# Patient Record
Sex: Male | Born: 1960
Health system: Southern US, Community
[De-identification: ages and names within clinical notes are randomized; demographics above are authoritative.]

## PROBLEM LIST (undated history)

## (undated) DIAGNOSIS — I219 Acute myocardial infarction, unspecified: Secondary | ICD-10-CM

## (undated) DIAGNOSIS — E119 Type 2 diabetes mellitus without complications: Secondary | ICD-10-CM

## (undated) DIAGNOSIS — K219 Gastro-esophageal reflux disease without esophagitis: Secondary | ICD-10-CM

## (undated) DIAGNOSIS — E611 Iron deficiency: Secondary | ICD-10-CM

## (undated) DIAGNOSIS — K922 Gastrointestinal hemorrhage, unspecified: Secondary | ICD-10-CM

## (undated) DIAGNOSIS — I10 Essential (primary) hypertension: Secondary | ICD-10-CM

## (undated) DIAGNOSIS — E7800 Pure hypercholesterolemia, unspecified: Secondary | ICD-10-CM

## (undated) DIAGNOSIS — C801 Malignant (primary) neoplasm, unspecified: Secondary | ICD-10-CM

## (undated) DIAGNOSIS — A048 Other specified bacterial intestinal infections: Secondary | ICD-10-CM

## (undated) HISTORY — PX: CARDIAC CATHETERIZATION: SHX172

## (undated) HISTORY — PX: OTHER SURGICAL HISTORY: SHX169

## (undated) HISTORY — PX: HERNIA REPAIR: SHX51

## (undated) HISTORY — PX: APPENDECTOMY: SHX54

## (undated) HISTORY — PX: COLECTOMY: SHX59

---

## 1999-04-09 ENCOUNTER — Ambulatory Visit (HOSPITAL_COMMUNITY): Admission: RE | Admit: 1999-04-09 | Discharge: 1999-04-09 | Payer: Self-pay | Admitting: Cardiology

## 1999-04-09 ENCOUNTER — Encounter: Payer: Self-pay | Admitting: Cardiology

## 1999-06-04 ENCOUNTER — Encounter: Payer: Self-pay | Admitting: Cardiology

## 1999-06-04 ENCOUNTER — Ambulatory Visit (HOSPITAL_COMMUNITY): Admission: RE | Admit: 1999-06-04 | Discharge: 1999-06-04 | Payer: Self-pay | Admitting: Cardiology

## 2001-03-11 ENCOUNTER — Ambulatory Visit (HOSPITAL_COMMUNITY): Admission: RE | Admit: 2001-03-11 | Discharge: 2001-03-11 | Payer: Self-pay | Admitting: Cardiology

## 2001-03-11 ENCOUNTER — Encounter: Payer: Self-pay | Admitting: Cardiology

## 2004-12-11 ENCOUNTER — Emergency Department (HOSPITAL_COMMUNITY): Admission: EM | Admit: 2004-12-11 | Discharge: 2004-12-12 | Payer: Self-pay | Admitting: Emergency Medicine

## 2005-08-05 ENCOUNTER — Inpatient Hospital Stay (HOSPITAL_COMMUNITY): Admission: EM | Admit: 2005-08-05 | Discharge: 2005-08-07 | Payer: Self-pay | Admitting: Emergency Medicine

## 2006-10-16 ENCOUNTER — Emergency Department (HOSPITAL_COMMUNITY): Admission: EM | Admit: 2006-10-16 | Discharge: 2006-10-17 | Payer: Self-pay | Admitting: Emergency Medicine

## 2007-06-10 ENCOUNTER — Ambulatory Visit (HOSPITAL_COMMUNITY): Admission: RE | Admit: 2007-06-10 | Discharge: 2007-06-10 | Payer: Self-pay | Admitting: Cardiology

## 2008-05-28 ENCOUNTER — Inpatient Hospital Stay (HOSPITAL_COMMUNITY): Admission: EM | Admit: 2008-05-28 | Discharge: 2008-05-31 | Payer: Self-pay | Admitting: Emergency Medicine

## 2010-07-12 LAB — CBC
HCT: 41.4 % (ref 39.0–52.0)
Hemoglobin: 14.6 g/dL (ref 13.0–17.0)
Hemoglobin: 15.4 g/dL (ref 13.0–17.0)
MCHC: 35.5 g/dL (ref 30.0–36.0)
Platelets: 199 10*3/uL (ref 150–400)
RBC: 4.72 MIL/uL (ref 4.22–5.81)
WBC: 8.9 10*3/uL (ref 4.0–10.5)

## 2010-07-17 LAB — CBC
HCT: 39.1 % (ref 39.0–52.0)
HCT: 40 % (ref 39.0–52.0)
Hemoglobin: 14.2 g/dL (ref 13.0–17.0)
MCHC: 35.6 g/dL (ref 30.0–36.0)
MCV: 92.3 fL (ref 78.0–100.0)
Platelets: 215 10*3/uL (ref 150–400)
Platelets: 249 10*3/uL (ref 150–400)
RDW: 12.8 % (ref 11.5–15.5)
RDW: 13.2 % (ref 11.5–15.5)

## 2010-07-17 LAB — DIFFERENTIAL
Basophils Absolute: 0 10*3/uL (ref 0.0–0.1)
Basophils Relative: 0 % (ref 0–1)
Eosinophils Absolute: 0.1 10*3/uL (ref 0.0–0.7)
Eosinophils Relative: 1 % (ref 0–5)
Monocytes Absolute: 0.5 10*3/uL (ref 0.1–1.0)

## 2010-07-17 LAB — COMPREHENSIVE METABOLIC PANEL
AST: 16 U/L (ref 0–37)
Albumin: 3.3 g/dL — ABNORMAL LOW (ref 3.5–5.2)
Alkaline Phosphatase: 56 U/L (ref 39–117)
BUN: 10 mg/dL (ref 6–23)
GFR calc Af Amer: >60 mL/min (ref >60)
Potassium: 4 mEq/L (ref 3.5–5.1)
Total Protein: 5.6 g/dL — ABNORMAL LOW (ref 6.0–8.3)

## 2010-07-17 LAB — POCT CARDIAC MARKERS
Myoglobin, poc: 80.6 ng/mL (ref 12–200)
Myoglobin, poc: 83.8 ng/mL (ref 12–200)
Troponin i, poc: <0.05 ng/mL (ref 0.00–0.09)

## 2010-07-17 LAB — CARDIAC PANEL(CRET KIN+CKTOT+MB+TROPI)
CK, MB: 1.1 ng/mL (ref 0.3–4.0)
CK, MB: 1.3 ng/mL (ref 0.3–4.0)
Relative Index: INVALID (ref 0.0–2.5)
Relative Index: INVALID (ref 0.0–2.5)
Relative Index: INVALID (ref 0.0–2.5)
Total CK: 85 U/L (ref 7–232)
Troponin I: <0.01 ng/mL (ref 0.00–0.06)
Troponin I: <0.01 ng/mL (ref 0.00–0.06)

## 2010-07-17 LAB — BRAIN NATRIURETIC PEPTIDE: Pro B Natriuretic peptide (BNP): 32 pg/mL (ref 0.0–100.0)

## 2010-07-17 LAB — BASIC METABOLIC PANEL
BUN: 8 mg/dL (ref 6–23)
CO2: 24 mEq/L (ref 19–32)
Glucose, Bld: 90 mg/dL (ref 70–99)
Potassium: 4.1 mEq/L (ref 3.5–5.1)
Sodium: 139 mEq/L (ref 135–145)

## 2010-07-17 LAB — LIPID PANEL
Cholesterol: 153 mg/dL (ref 0–200)
HDL: 18 mg/dL — ABNORMAL LOW (ref >39)
LDL Cholesterol: 112 mg/dL — ABNORMAL HIGH (ref 0–99)
Total CHOL/HDL Ratio: 8.5 RATIO

## 2010-07-17 LAB — HEPARIN LEVEL (UNFRACTIONATED): Heparin Unfractionated: 0.48 IU/mL (ref 0.30–0.70)

## 2010-07-17 LAB — PROTIME-INR: Prothrombin Time: 12.4 seconds (ref 11.6–15.2)

## 2010-08-14 NOTE — Discharge Summary (Signed)
NAMEJAYLAND, NULL                ACCOUNT NO.:  [id number]   MEDICAL RECORD NO.:  [id number]          PATIENT TYPE:  INP   LOCATION:  2030                         FACILITY:  [facility]   PHYSICIAN:  Mohan N. Sharyn Lull, M.D. DATE OF BIRTH:  [birth year]   DATE OF ADMISSION:  05/28/2008  DATE OF DISCHARGE:  05/31/2008                               DISCHARGE SUMMARY   ADMITTING DIAGNOSES:  1. Unstable angina, rule out myocardial infarction.  2. Coronary artery disease.   FINAL DIAGNOSES:  1. Stable angina.  2. Coronary artery disease.  3. 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  4. Hypertension.  5. Hypercholesteremia.  6. History of tobacco abuse.  7. Positive family history of coronary artery disease.   DISCHARGE HOME MEDICATIONS:  1. Enteric-coated aspirin 325 mg 1 tablet daily.  2. Plavix 75 mg 1 tablet daily.  3. Altace 5 mg 1 capsule twice daily.  4. Cozaar 100 mg 1 tablet daily.  5. Lipitor 80 mg 1 tablet daily.  6. Niaspan 500 mg 1 tablet twice daily.  7. Atenolol 50 mg 1 tablet daily.  8. Imdur 60 mg 1 tablet daily.  9. Xanax 0.25 mg 1 tablet twice daily.  10.Pepcid 20 mg 1 tablet twice daily.   DIET:  Low salt and low cholesterol.   ACTIVITY:  As tolerated.   CONDITION AT DISCHARGE:  Stable.   FOLLOWUP:  Follow up with me in 1 week.   BRIEF HISTORY AND HOSPITAL COURSE:  Mr. Elhadji Pecore is 50 year old  white male with past medical history significant for coronary artery  disease, history of inferior wall MI in August 1997.  He had PTCA  stenting to left circumflex, hypertension, hypercholesteremia, history  of tobacco abuse, was admitted by Dr. Shana Chute on May 28, 2008 with  substernal chest discomfort radiating to the left hand and fingers.  Denies any nausea or vomiting, and diaphoresis.  He took 2 sublingual  nitro with relief.  EKG done in the ED showed old inferior wall MI with  no acute ischemic changes.   PAST MEDICAL HISTORY:  As  above.   FAMILY HISTORY:  Father died at the age of 4 due to massive MI.   SOCIAL HISTORY:  He is former smoker.   ALLERGIES:  No known drug allergies.   MEDICATIONS:  As above.   PHYSICAL EXAMINATION:  VITAL SIGNS:  His blood pressure was 171/98,  pulse was 77.  He was afebrile.  NECK:  Supple.  No JVD.  No bruits.  LUNGS:  Clear to auscultation without rhonchi and rales.  CARDIOVASCULAR:  S1 and S2 was normal.  There was no S3 or S4 gallop.  There was no pericardial rub.  ABDOMEN:  Soft.  Bowel sounds are present, nontender.  EXTREMITIES:  There is no clubbing, cyanosis, or edema.   LABORATORY DATA:  Hemoglobin was 14.2, hematocrit 40.0, white count of  6.3.  Potassium was 4.1, glucose 90, BUN 8, creatinine 0.99.  His 2 sets  of cardiac enzymes were negative.  BNP was 32.  His lipid panel was not  done  in this admission.  Persantine Myoview showed very small area of  ischemia involving the inferolateral portion of the left ventricular  apex with normal EF of 68% with normal left ventricular wall motion.  Chest x-ray showed low lung volumes, no evidence of infiltrate, edema or  effusions.   BRIEF HOSPITAL COURSE:  The patient was admitted to Telemetry Unit.  MI  was ruled out by serial enzymes and EKG.  The patient did not had any  episodes of chest pain during the hospital stay.  The patient  subsequently underwent Persantine Myoview, which showed very small focus  of ischemia and the inferolateral wall at the apex with normal LV  systolic function.  Discussed at length with the patient regarding  various options of treatment, i.e., medical versus invasive left cath  possible PTCA stenting as the patient had very atypical chest pain,  which lasted few minutes only and as no EKG changes and the patient has  been ambulating in hallway without any problems, the patient agreed for  medical management for now and if he continues to have recurrent chest  pain, we will consider  invasive treatment as an outpatient.  The patient  will be discharged home on the above medications and will be followed up  in my office in 1 week.      Eduardo Osier. Sharyn Lull, M.D.  Electronically Signed     MNH/MEDQ  D:  05/31/2008  T:  05/31/2008  Job:  [id number]

## 2010-08-17 NOTE — Cardiovascular Report (Signed)
NAMEKRRISH, FREUND                 ACCOUNT NO.:  [id number]   MEDICAL RECORD NO.:  [id number]          PATIENT TYPE:  INP   LOCATION:  6526                         FACILITY:  [facility]   PHYSICIAN:  Mohan N. Sharyn Lull, M.D. DATE OF BIRTH:  [birth year]   DATE OF PROCEDURE:  08/06/2005  DATE OF DISCHARGE:  08/07/2005                              CARDIAC CATHETERIZATION   PROCEDURE:  1.  Left cardiac catheterization with selective left and right coronary      angiography, left ventriculography via right groin using Judkins      technique.  2.  Successful percutaneous transluminal coronary angioplasty to mid and      distal left circumflex using 3.0 x 12-mm-long Maverick balloon.  3.  Successful deployment of 3.0 x 33-mm-long CYPHER drug-eluting stent in      mid and distal left circumflex.  4.  Successful post dilatation of the stent using 8.25 x 13-mm-long      PowerSail balloon.   INDICATIONS FOR PROCEDURE:  Mr. Tosh is a 50 year old white male with past  medical history significant for coronary artery disease, status post  inferior wall in August 1997, status post PCI to left circumflex,  hypertension, hypercholesteremia, history of tobacco abuse, who came to the  ER complaining of dull aching chest pain radiating to the left arm, grade  5/10, and took 2 aspirin with relief.  The patient went to [facility] ER  and as pain was resolved while going to the ER, he went home, again started  having retrosternal chest pain radiating to the left arm and he took 1  sublingual nitro with relief of chest pain.  He denies any nausea, vomiting  or diaphoresis, denies shortness of breath, denies palpitation,  lightheadedness or syncope, denies relation of chest pain to food, breathing  or movement, denies PND, orthopnea or leg swelling.  He states chest pain  appears to be similar in nature when he had MI, but not so severe when he  had MI in the past.  The patient was admitted to telemetry unit.  MI  was  ruled out by serial enzymes and EKG.  Due to typical anginal chest pain and  multiple risk factors, I discussed with the patient regarding left cath,  possible PTCA and stenting, its risks and benefits, i.e., death, MI, stroke,  need for emergency CABG, risk of restenosis, local vascular complications,  etc, and consented for the procedure.   DESCRIPTION OF PROCEDURE:  After obtaining the informed consent, the patient  was brought to the cath lab and was placed on fluoroscopy table.  Right  groin was prepped and draped in the usual fashion.  2% Xylocaine was used  for local anesthesia in the right groin.  With the help of a thin-wall  needle, a 6-French arterial sheath was placed.  Sheath was aspirated and  flushed.  Next, a 6-French left Judkins catheter was advanced over the wire  under fluoroscopic guidance up to the ascending aorta.  Wire was pulled out.  The catheter was aspirated and connected to the manifold.  Catheter was  further  advanced and engaged into left coronary ostium.  Multiple views of  the left system were taken.  Next, the catheter was disengaged and was  pulled out over the wire and was replaced with 6-French right Judkins  catheter, which was advanced over the wire under fluoroscopic guidance up to  the ascending aorta.  Wire was pulled out.  The catheter was aspirated and  connected to the manifold.  Catheter was further advanced and engaged into  the right coronary ostium.  A single view of the right coronary artery was  obtained.  Next, the catheter was disengaged and was pulled out over the  wire and was replaced with a 6-French pigtail catheter, which was advanced  over the wire under fluoroscopic guidance up to the ascending aorta.  Wire  was pulled out.  The catheter was aspirated and connected to the manifold.  Catheter was further advanced across the aortic valve into the LV.  LV  pressures were recorded.  Next left ventriculography was done in  30-degree  RAO position.  Post-angiographic pressures were recorded from LV and then  pullback pressures were recorded from the aorta.  There was no gradient  across the aortic valve.  Next, the pigtail catheter was pulled out over the  wire, sheaths were aspirated and flushed.   FINDINGS:  LV showed inferobasal wall hypokinesia, LVH with EF of 45% to  50%.  Left main has 40% to 50% ostial stenosis.  LAD has 15% to 20% proximal  and mid-stenosis and 30% to 40% distal stenosis.  Diagonal 1 to diagonal 3  were very small.  Left circumflex has 30% to 40% proximal stenosis and then  a 85% to 90% sequential mid and distal stenosis.  OM-1 is less than 0.5 mm.  OM-2 is moderate-sized, which has 10% to 20% stenosis.  OM-3 is less than  0.5 mm.  OM-4 is less than 1.25 mm, a very small vessel which is diffusely  diseased.  RCA was 100% occluded, which is a small nondominant vessel  filling faintly from left system..   INTERVENTIONAL PROCEDURE:  Successful PTCA to mid and distal left circumflex  was done using 3.0 x 12-mm-long Maverick balloon for predilatation and then  3.0 x 33-mm-long CYPHER drug-eluting stent was deployed at 13 atmospheric  pressure in mid and distal left circumflex.  Stent was postdilated using  3.25 x 13-mm-long PowerSail balloon going up to 18 atmospheric pressure.  Lesion was dilated from 85% to 90% to 0% residual and with excellent TIMI  grade 3 distal flow without evidence of dissection or distal embolization.  The patient had occlusion of a very small OM-4 due to plaque shift versus  occlusion due to the stent skirt, which was not felt suitable for PCI and  was a very small vessel.  The patient received weight-based heparin,  Integrilin and 600 mg of Plavix during the procedure.  The patient tolerated  the procedure well.  There were no complications.  The patient was  transferred to recovery room in stable condition.  Post procedure, the patient's EKG appears to be  normal with no evidence of acute ischemic  changes or infarct.           ______________________________  Eduardo Osier. Sharyn Lull, M.D.     MNH/MEDQ  D:  08/07/2005  T:  08/08/2005  Job:  [id number]   cc:   [facility]

## 2010-08-17 NOTE — Discharge Summary (Signed)
James Russell, James Russell                 ACCOUNT NO.:  [id number]   MEDICAL RECORD NO.:  [id number]          PATIENT TYPE:  INP   LOCATION:  6526                         FACILITY:  [facility]   PHYSICIAN:  Mohan N. Sharyn Lull, M.D. DATE OF BIRTH:  [birth year]   DATE OF ADMISSION:  08/04/2005  DATE OF DISCHARGE:  08/07/2005                                 DISCHARGE SUMMARY   ADMITTING DIAGNOSES:  1.  New onset angina/unstable angina, rule out myocardial infarction.  2.  Coronary artery disease.  3.  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  4.  Hypertension.  5.  Hypercholesteremia.  6.  Tobacco abuse.  7.  Positive family history of coronary artery disease.   FINAL DIAGNOSES:  1.  Status post unstable angina.  2.  Status post percutaneous coronary intervention to mid and distal left      circumflex as per procedure report.  3.  Coronary artery disease.  4.  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  5.  Hypertension.  6.  Hypercholesteremia.  7.  Prediabetic.  8.  Tobacco abuse.  9.  Positive family history of coronary artery disease.   DISCHARGE HOME MEDICATIONS:  1.  Enteric-coated aspirin 325 mg one tablet daily.  2.  Plavix 75 mg one tablet daily with food.  3.  Toprol-XL 100 mg one tablet daily.  4.  Altace 10 mg one capsule daily.  5.  Vytorin 10/80 one tablet daily.  6.  Niaspan 500 mg one tablet twice daily.  7.  Nitrostat 0.4 mg sublingual used as directed.   DIET:  Low salt, low cholesterol, 1800 calories ADA diet.  The patient has  been advised to avoid sweets, ice cream cake.   ACTIVITY:  Avoid any lifting, pushing or pulling for 48 hours.  Post PTCA  and stent instructions have been given.  Follow up with me in 1 week.   CONDITION AT DISCHARGE:  Stable.   BRIEF HISTORY AND HOSPITAL COURSE:  James Russell is 50 year old white male with  past medical history significant for coronary artery disease status post  inferior wall MI in  August  1997 - he had PCI to left circumflex,  hypertension, hypercholesteremia, history of tobacco abuse.  He came to the  ER complaining of developing chest pain radiating to the left arm, 5/10.  Took two aspirin with relief while going to [facility] ER and again started  having chest pain radiating to the left arm, took one sublingual  nitroglycerin with relief of chest pain.  Denies any nausea, diaphoresis,  shortness of breath.  Denies palpation, lightheadedness or syncope.  Denies  relation of chest pain to food, breathing or movement.  Denies PND,  orthopnea, leg swelling.  States chest pain appears to be similar in nature  but much more severe when he had MI.   PAST MEDICAL HISTORY:  He had back surgery.  Status post appendectomy in the  past.   ALLERGIES:  No known drug allergies.   MEDICATION AT HOME:  He was on:  1.  Toprol-XL 100  mg p.o. q.a.m. and 50 mg p.o. h.s.  2.  Zocor 80 mg p.o. daily.  3.  Aspirin 81 mg p.o. daily.   SOCIAL HISTORY:  He is married and from [place].  Works at [employer] as  Engineer, agricultural.  Smokes one-fourth pack per day now, used to smoke a-  half to one pack daily before.  No history of alcohol or drug abuse.   FAMILY HISTORY:  Father died of a massive MI at the age of 67.  He was  hypertensive, diabetic.  Mother is alive.  She has coronary artery disease.  Two sisters on good health.   EXAMINATION:  GENERAL:  He is alert, awake, oriented x3, in no acute  distress.  VITAL SIGNS:  Blood pressure was 158/93, pulse was 91 and regular.  HEENT:  Conjunctivae were pink.  NECK:  Supple, no JVD, no bruit.  LUNGS:  Clear to auscultation bilaterally.   Three sets of CPK, MB and troponin-I point-of-care were normal.  Total  cholesterol was 182, HDL was 24, LDL 129.  His potassium was 3.8, glucose  116, BUN 9, creatinine 0.9.  CK by lab was 140, MB 2.4.  Second set CK 111,  MB 2.2.  Third set CK postprocedure was 213, MB of 17, 6.0. MI was ruled out  by  serial enzymes and EKG.  The patient subsequently underwent left cardiac  catheterization with selective left and right coronary angiography and PTCA  and stenting to left circumflex as per procedure report.  The patient  tolerated the procedure well.  The patient did have mild chest discomfort  during the procedure due to occlusion of very, very small OM-4 branch of  circumflex which was felt not suitable for any intervention.  The patient  did not have further issues and will be followed up in my office in 1 week.           ______________________________  Eduardo Osier. Sharyn Lull, M.D.     MNH/MEDQ  D:  08/07/2005  T:  08/08/2005  Job:  [id number]

## 2010-12-24 LAB — HEPATIC FUNCTION PANEL
AST: 21
Bilirubin, Direct: 0.1
Indirect Bilirubin: 0.6

## 2010-12-24 LAB — BASIC METABOLIC PANEL
Calcium: 9
GFR calc Af Amer: >60
GFR calc non Af Amer: >60
Glucose, Bld: 116 — ABNORMAL HIGH
Sodium: 140

## 2010-12-24 LAB — LIPID PANEL
Cholesterol: 120
LDL Cholesterol: 82
Triglycerides: 86
VLDL: 17

## 2011-01-14 LAB — COMPREHENSIVE METABOLIC PANEL
Alkaline Phosphatase: 56
BUN: 8
CO2: 25
Chloride: 106
Creatinine, Ser: 1
GFR calc non Af Amer: >60
Potassium: 4.8
Total Bilirubin: 0.8

## 2011-01-14 LAB — URINALYSIS, ROUTINE W REFLEX MICROSCOPIC
Bilirubin Urine: NEGATIVE
Glucose, UA: NEGATIVE
Ketones, ur: NEGATIVE
pH: 6

## 2011-01-14 LAB — DIFFERENTIAL
Basophils Absolute: 0.1
Basophils Relative: 1
Eosinophils Relative: 1
Lymphocytes Relative: 26
Neutro Abs: 4

## 2011-01-14 LAB — CBC
HCT: 41.4
Hemoglobin: 14.6
MCV: 91.1
RBC: 4.55
WBC: 6.3

## 2011-01-14 LAB — LIPASE, BLOOD: Lipase: 31

## 2011-02-19 ENCOUNTER — Other Ambulatory Visit: Payer: Self-pay | Admitting: Cardiology

## 2011-05-13 ENCOUNTER — Other Ambulatory Visit: Payer: Self-pay | Admitting: Cardiology

## 2011-07-03 ENCOUNTER — Other Ambulatory Visit: Payer: Self-pay | Admitting: Cardiology

## 2011-10-17 ENCOUNTER — Other Ambulatory Visit: Payer: Self-pay | Admitting: Cardiology

## 2012-08-26 ENCOUNTER — Other Ambulatory Visit (HOSPITAL_COMMUNITY): Payer: Self-pay | Admitting: Cardiology

## 2012-08-26 DIAGNOSIS — R079 Chest pain, unspecified: Secondary | ICD-10-CM

## 2012-09-25 ENCOUNTER — Encounter (HOSPITAL_COMMUNITY): Payer: PRIVATE HEALTH INSURANCE

## 2013-04-28 ENCOUNTER — Emergency Department (HOSPITAL_BASED_OUTPATIENT_CLINIC_OR_DEPARTMENT_OTHER): Payer: PRIVATE HEALTH INSURANCE

## 2013-04-28 ENCOUNTER — Emergency Department (HOSPITAL_BASED_OUTPATIENT_CLINIC_OR_DEPARTMENT_OTHER)
Admission: EM | Admit: 2013-04-28 | Discharge: 2013-04-28 | Disposition: A | Discharge status: Home / Self Care | Payer: PRIVATE HEALTH INSURANCE | Attending: Emergency Medicine | Admitting: Emergency Medicine

## 2013-04-28 ENCOUNTER — Encounter (HOSPITAL_BASED_OUTPATIENT_CLINIC_OR_DEPARTMENT_OTHER): Payer: Self-pay | Admitting: Emergency Medicine

## 2013-04-28 DIAGNOSIS — I10 Essential (primary) hypertension: Secondary | ICD-10-CM | POA: Insufficient documentation

## 2013-04-28 DIAGNOSIS — E7800 Pure hypercholesterolemia, unspecified: Secondary | ICD-10-CM | POA: Insufficient documentation

## 2013-04-28 DIAGNOSIS — I252 Old myocardial infarction: Secondary | ICD-10-CM | POA: Insufficient documentation

## 2013-04-28 DIAGNOSIS — Z79899 Other long term (current) drug therapy: Secondary | ICD-10-CM | POA: Insufficient documentation

## 2013-04-28 DIAGNOSIS — Z7902 Long term (current) use of antithrombotics/antiplatelets: Secondary | ICD-10-CM | POA: Insufficient documentation

## 2013-04-28 DIAGNOSIS — Z7982 Long term (current) use of aspirin: Secondary | ICD-10-CM | POA: Insufficient documentation

## 2013-04-28 DIAGNOSIS — Z862 Personal history of diseases of the blood and blood-forming organs and certain disorders involving the immune mechanism: Secondary | ICD-10-CM | POA: Insufficient documentation

## 2013-04-28 DIAGNOSIS — Z87891 Personal history of nicotine dependence: Secondary | ICD-10-CM | POA: Insufficient documentation

## 2013-04-28 DIAGNOSIS — Z951 Presence of aortocoronary bypass graft: Secondary | ICD-10-CM | POA: Insufficient documentation

## 2013-04-28 DIAGNOSIS — K5792 Diverticulitis of intestine, part unspecified, without perforation or abscess without bleeding: Secondary | ICD-10-CM

## 2013-04-28 DIAGNOSIS — Z8619 Personal history of other infectious and parasitic diseases: Secondary | ICD-10-CM | POA: Insufficient documentation

## 2013-04-28 DIAGNOSIS — K5732 Diverticulitis of large intestine without perforation or abscess without bleeding: Secondary | ICD-10-CM | POA: Insufficient documentation

## 2013-04-28 HISTORY — DX: Essential (primary) hypertension: I10

## 2013-04-28 HISTORY — DX: Other specified bacterial intestinal infections: A04.8

## 2013-04-28 HISTORY — DX: Acute myocardial infarction, unspecified: I21.9

## 2013-04-28 HISTORY — DX: Pure hypercholesterolemia, unspecified: E78.00

## 2013-04-28 HISTORY — DX: Iron deficiency: E61.1

## 2013-04-28 LAB — BASIC METABOLIC PANEL
BUN: 14 mg/dL (ref 6–23)
CHLORIDE: 104 meq/L (ref 96–112)
CO2: 22 meq/L (ref 19–32)
CREATININE: 0.9 mg/dL (ref 0.50–1.35)
Calcium: 8.8 mg/dL (ref 8.4–10.5)
GFR calc non Af Amer: >90 mL/min (ref >90)
Glucose, Bld: 150 mg/dL — ABNORMAL HIGH (ref 70–99)
Potassium: 4.3 mEq/L (ref 3.7–5.3)
Sodium: 140 mEq/L (ref 137–147)

## 2013-04-28 LAB — CBC
HEMATOCRIT: 33.4 % — AB (ref 39.0–52.0)
Hemoglobin: 10.8 g/dL — ABNORMAL LOW (ref 13.0–17.0)
MCH: 27 pg (ref 26.0–34.0)
MCHC: 32.3 g/dL (ref 30.0–36.0)
MCV: 83.5 fL (ref 78.0–100.0)
Platelets: 352 10*3/uL (ref 150–400)
RBC: 4 MIL/uL — ABNORMAL LOW (ref 4.22–5.81)
RDW: 13.6 % (ref 11.5–15.5)
WBC: 7.6 10*3/uL (ref 4.0–10.5)

## 2013-04-28 LAB — URINALYSIS, ROUTINE W REFLEX MICROSCOPIC
BILIRUBIN URINE: NEGATIVE
Glucose, UA: NEGATIVE mg/dL
HGB URINE DIPSTICK: NEGATIVE
Ketones, ur: NEGATIVE mg/dL
Leukocytes, UA: NEGATIVE
Nitrite: NEGATIVE
PH: 6 (ref 5.0–8.0)
Protein, ur: NEGATIVE mg/dL
SPECIFIC GRAVITY, URINE: 1.01 (ref 1.005–1.030)
Urobilinogen, UA: 0.2 mg/dL (ref 0.0–1.0)

## 2013-04-28 LAB — OCCULT BLOOD X 1 CARD TO LAB, STOOL: Fecal Occult Bld: POSITIVE — AB

## 2013-04-28 MED ORDER — CIPROFLOXACIN HCL 500 MG PO TABS: 500.0000 mg | ORAL_TABLET | Freq: Two times a day (BID) | ORAL | Status: DC

## 2013-04-28 MED ORDER — DOCUSATE SODIUM 100 MG PO CAPS: 100.0000 mg | ORAL_CAPSULE | Freq: Two times a day (BID) | ORAL | Status: DC

## 2013-04-28 MED ORDER — OXYCODONE-ACETAMINOPHEN 5-325 MG PO TABS: 1.0000 | ORAL_TABLET | Freq: Four times a day (QID) | ORAL | Status: DC | PRN

## 2013-04-28 MED ORDER — METRONIDAZOLE 500 MG PO TABS: 500.0000 mg | ORAL_TABLET | Freq: Two times a day (BID) | ORAL | Status: DC

## 2013-04-28 MED ORDER — IOHEXOL 300 MG/ML  SOLN
100.0000 mL | Freq: Once | INTRAMUSCULAR | Status: AC | PRN
  Administered 2013-04-28: 100 mL via INTRAVENOUS

## 2013-04-28 NOTE — ED Notes (Signed)
Report received pt care assumed. Pt is awake and alert, states his pain is 4-5/10. Pt smiling, pleasant and cooperative. Aware of plan of care.

## 2013-04-28 NOTE — ED Notes (Signed)
Patient transported to CT ambulatory with tech. 

## 2013-04-28 NOTE — ED Notes (Signed)
MD at bedside. 

## 2013-04-28 NOTE — ED Provider Notes (Signed)
CSN: [id number]     Arrival date & time 04/28/13  0524 History   First MD Initiated Contact with Patient 04/28/13 0541     Chief Complaint  Patient presents with  . Abdominal Pain   (Consider location/radiation/quality/duration/timing/severity/associated sxs/prior Treatment) Patient is a 53 y.o. male presenting with abdominal pain. The history is provided by the patient.  Abdominal Pain Pain location:  LLQ Pain quality: aching and sharp   Pain radiates to:  Does not radiate Pain severity:  Moderate Onset quality:  Gradual Duration:  4 days Timing:  Intermittent (pain free yesterday, came back this morning) Progression:  Worsening Chronicity:  New Context: not eating, not sick contacts and not trauma   Relieved by:  Nothing Worsened by:  Nothing tried Associated symptoms: no cough, no fever, no shortness of breath and no vomiting     Past Medical History  Diagnosis Date  . H. pylori infection   . Iron deficiency   . Hypertension   . MI (myocardial infarction)   . Hypercholesteremia    Past Surgical History  Procedure Laterality Date  . Coronary artery bypass graft     No family history on file. History  Substance Use Topics  . Smoking status: Former Research scientist (life sciences)  . Smokeless tobacco: Not on file  . Alcohol Use: No    Review of Systems  Constitutional: Negative for fever.  Respiratory: Negative for cough and shortness of breath.   Gastrointestinal: Positive for abdominal pain. Negative for vomiting.  All other systems reviewed and are negative.    Allergies  Review of patient's allergies indicates no known allergies.  Home Medications   Current Outpatient Rx  Name  Route  Sig  Dispense  Refill  . amLODipine (NORVASC) 10 MG tablet   Oral   Take 10 mg by mouth daily.         Marland Kitchen aspirin 325 MG tablet   Oral   Take 325 mg by mouth daily.         Marland Kitchen atenolol (TENORMIN) 100 MG tablet   Oral   Take 50 mg by mouth daily.         Marland Kitchen atorvastatin (LIPITOR) 10  MG tablet   Oral   Take 80 mg by mouth daily.         . clopidogrel (PLAVIX) 75 MG tablet   Oral   Take 75 mg by mouth daily with breakfast.         . omeprazole (PRILOSEC) 20 MG capsule   Oral   Take 20 mg by mouth daily.         . ramipril (ALTACE) 1.25 MG capsule   Oral   Take 1.25 mg by mouth daily.          BP 174/92  Pulse 88  Temp(Src) 98.9 F (37.2 C) (Oral)  Resp 18  Ht 5\' 11"  (1.803 m)  Wt 220 lb (99.791 kg)  BMI 30.70 kg/m2  SpO2 100% Physical Exam  Nursing note and vitals reviewed. Constitutional: He is oriented to person, place, and time. He appears well-developed and well-nourished. No distress.  HENT:  Head: Normocephalic and atraumatic.  Mouth/Throat: No oropharyngeal exudate.  Eyes: EOM are normal. Pupils are equal, round, and reactive to light.  Neck: Normal range of motion. Neck supple.  Cardiovascular: Normal rate and regular rhythm.  Exam reveals no friction rub.   No murmur heard. Pulmonary/Chest: Effort normal and breath sounds normal. No respiratory distress. He has no wheezes. He has no rales.  Abdominal: He exhibits no distension. There is tenderness (LLQ). There is no rebound.  Musculoskeletal: Normal range of motion. He exhibits no edema.  Neurological: He is alert and oriented to person, place, and time.  Skin: He is not diaphoretic.    ED Course  Procedures (including critical care time) Labs Review Labs Reviewed  CBC  BASIC METABOLIC PANEL   Imaging Review Ct Abdomen Pelvis W Contrast  04/28/2013   CLINICAL DATA:  Question diverticulitis, lower abdominal pain  EXAM: CT ABDOMEN AND PELVIS WITH CONTRAST  TECHNIQUE: Multidetector CT imaging of the abdomen and pelvis was performed using the standard protocol following bolus administration of intravenous contrast.  CONTRAST:  OMNIPAQUE IOHEXOL 300 MG/ML  SOLN  COMPARISON:  None available.  FINDINGS: The visualized lung bases are clear.  A 1.3 cm hypodensity is noted within  the right hepatic lobe (series 2, image 18), indeterminate. The liver is otherwise unremarkable. Gallbladder is within normal limits. No biliary ductal dilatation. The spleen, right adrenal glands, and pancreas demonstrate a normal contrast enhanced appearance. A and 1.6 cm predominately hypodense nodule is seen arising from the left adrenal gland (series 2, image 34).  Kidneys are within normal limits without evidence of nephrolithiasis, hydronephrosis, or focal enhancing renal mass.  There is no evidence of bowel obstruction. Scattered colonic diverticula are present. There is mild inflammatory fat stranding about a diverticulum with a in the ascending colon (series 2, image 45), suggestive of acute diverticulitis. The appendix itself is not definitely visualized. No free air identified.  Fat containing paraumbilical hernia is noted.  Bladder is within normal limits.  Prostate is unremarkable.  No pathologically enlarged intra-abdominal pelvic lymph nodes are seen.  No free air or fluid. Scattered aorto bi-iliac calcifications noted.  Osseous structures are within normal limits. No focal lytic or blastic osseous lesions. Prominent degenerative changes noted about the left hip. There is asymmetric sclerosis and mottling of the left iliac wing (series 5, image 75). Scattered areas of lytic changes and lucency are seen within this region. The left iliac wing itself is slightly enlarged as compared to the contralateral right with cortical thickening.  IMPRESSION: 1. Mild inflammatory fat stranding about a prominent diverticulum within the ascending colon, most consistent with acute diverticulitis. No evidence of perforation. 2. 1.5 cm indeterminate left adrenal nodule. Further evaluation with contrast enhanced MRI could be performed as clinically indicated. 3. Indeterminate 1.3 cm hypodense lesion within the right hepatic lobe. This could also be further evaluated with contrast-enhanced MRI. 4. Asymmetric mixed lytic  and sclerotic appearance of the left iliac wing. This finding is of uncertain etiology, but may represent Paget's disease. Possible sclerotic metastases are not entirely excluded. Further evaluation with MRI and/or bone scan is recommended. 5. Fat containing paraumbilical hernia.   Electronically Signed   By: Rise Mu M.D.   On: 04/28/2013 07:06    EKG Interpretation   None       MDM   1. Diverticulitis    47M here with abdominal pain. Intermittent over the past few days. LLQ, sharp/aching, non radiating. No instigating, alleviating, exacerbating factors. No N/V/D. No fevers. Hx of H. Pylori, recent triple therapy treatment for this by his PCP. Reports bright red blood on the toilet paper this morning, black stools since he is on iron. Placed on iron for low Hct (32) tested one week ago. His PCP is following him for this. Has GI f/u in 2-3 weeks. AFVSS here. LLQ pain on exam. Bright red blood  on anus from hard BM. Hemoccult positive on black stool also. Will scan for possible diverticulitis. CT shows acute uncomplicated diverticulitis. Instructed patient to f/u with PCP, given cipro, flagyl, pain meds. Stable for discharge.  I have reviewed all labs and imaging and considered them in my medical decision making.   Osvaldo Shipper, MD 04/28/13 [phone]

## 2013-04-28 NOTE — ED Notes (Signed)
Pt reports recent hx of H pylori, keeps having recurrent episodes of same.  Lower abd pain.  No n/v/d.

## 2013-04-28 NOTE — Discharge Instructions (Signed)
Diverticulitis °A diverticulum is a small pouch or sac on the colon. Diverticulosis is the presence of these diverticula on the colon. Diverticulitis is the irritation (inflammation) or infection of diverticula. °CAUSES  °The colon and its diverticula contain bacteria. If food particles block the tiny opening to a diverticulum, the bacteria inside can grow and cause an increase in pressure. This leads to infection and inflammation and is called diverticulitis. °SYMPTOMS  °· Abdominal pain and tenderness. Usually, the pain is located on the left side of your abdomen. However, it could be located elsewhere. °· Fever. °· Bloating. °· Feeling sick to your stomach (nausea). °· Throwing up (vomiting). °· Abnormal stools. °DIAGNOSIS  °Your caregiver will take a history and perform a physical exam. Since many things can cause abdominal pain, other tests may be necessary. Tests may include: °· Blood tests. °· Urine tests. °· X-ray of the abdomen. °· CT scan of the abdomen. °Sometimes, surgery is needed to determine if diverticulitis or other conditions are causing your symptoms. °TREATMENT  °Most of the time, you can be treated without surgery. Treatment includes: °· Resting the bowels by only having liquids for a few days. As you improve, you will need to eat a low-fiber diet. °· Intravenous (IV) fluids if you are losing body fluids (dehydrated). °· Antibiotic medicines that treat infections may be given. °· Pain and nausea medicine, if needed. °· Surgery if the inflamed diverticulum has burst. °HOME CARE INSTRUCTIONS  °· Try a clear liquid diet (broth, tea, or water for as long as directed by your caregiver). You may then gradually begin a low-fiber diet as tolerated.  °A low-fiber diet is a diet with less than 10 grams of fiber. Choose the foods below to reduce fiber in the diet: °· White breads, cereals, rice, and pasta. °· Cooked fruits and vegetables or soft fresh fruits and vegetables without the skin. °· Ground or  well-cooked tender beef, ham, veal, lamb, pork, or poultry. °· Eggs and seafood. °· After your diverticulitis symptoms have improved, your caregiver may put you on a high-fiber diet. A high-fiber diet includes 14 grams of fiber for every 1000 calories consumed. For a standard 2000 calorie diet, you would need 28 grams of fiber. Follow these diet guidelines to help you increase the fiber in your diet. It is important to slowly increase the amount fiber in your diet to avoid gas, constipation, and bloating. °· Choose whole-grain breads, cereals, pasta, and brown rice. °· Choose fresh fruits and vegetables with the skin on. Do not overcook vegetables because the more vegetables are cooked, the more fiber is lost. °· Choose more nuts, seeds, legumes, dried peas, beans, and lentils. °· Look for food products that have greater than 3 grams of fiber per serving on the Nutrition Facts label. °· Take all medicine as directed by your caregiver. °· If your caregiver has given you a follow-up appointment, it is very important that you go. Not going could result in lasting (chronic) or permanent injury, pain, and disability. If there is any problem keeping the appointment, call to reschedule. °SEEK MEDICAL CARE IF:  °· Your pain does not improve. °· You have a hard time advancing your diet beyond clear liquids. °· Your bowel movements do not return to normal. °SEEK IMMEDIATE MEDICAL CARE IF:  °· Your pain becomes worse. °· You have an oral temperature above 102° F (38.9° C), not controlled by medicine. °· You have repeated vomiting. °· You have bloody or black, tarry stools. °·   Symptoms that brought you to your caregiver become worse or are not getting better. °MAKE SURE YOU:  °· Understand these instructions. °· Will watch your condition. °· Will get help right away if you are not doing well or get worse. °Document Released: 12/26/2004 Document Revised: 06/10/2011 Document Reviewed: 04/23/2010 °[employer]® Patient Information  ©2014 [employer]. ° °

## 2016-01-25 ENCOUNTER — Encounter (HOSPITAL_COMMUNITY): Payer: Self-pay | Admitting: Emergency Medicine

## 2016-01-25 ENCOUNTER — Inpatient Hospital Stay (HOSPITAL_COMMUNITY)
Admission: EM | Admit: 2016-01-25 | Discharge: 2016-02-02 | DRG: 441 | Disposition: A | Discharge status: Home / Self Care | Payer: PRIVATE HEALTH INSURANCE | Attending: Internal Medicine | Admitting: Internal Medicine

## 2016-01-25 ENCOUNTER — Encounter (HOSPITAL_COMMUNITY)
Admission: EM | Disposition: A | Discharge status: Home / Self Care | Payer: Self-pay | Source: Home / Self Care | Attending: Internal Medicine

## 2016-01-25 ENCOUNTER — Emergency Department (HOSPITAL_COMMUNITY): Payer: PRIVATE HEALTH INSURANCE

## 2016-01-25 DIAGNOSIS — K920 Hematemesis: Secondary | ICD-10-CM

## 2016-01-25 DIAGNOSIS — R188 Other ascites: Secondary | ICD-10-CM | POA: Diagnosis present

## 2016-01-25 DIAGNOSIS — K766 Portal hypertension: Principal | ICD-10-CM | POA: Diagnosis present

## 2016-01-25 DIAGNOSIS — T451X5A Adverse effect of antineoplastic and immunosuppressive drugs, initial encounter: Secondary | ICD-10-CM | POA: Diagnosis present

## 2016-01-25 DIAGNOSIS — K8020 Calculus of gallbladder without cholecystitis without obstruction: Secondary | ICD-10-CM | POA: Diagnosis present

## 2016-01-25 DIAGNOSIS — Z9221 Personal history of antineoplastic chemotherapy: Secondary | ICD-10-CM

## 2016-01-25 DIAGNOSIS — G620 Drug-induced polyneuropathy: Secondary | ICD-10-CM

## 2016-01-25 DIAGNOSIS — R1032 Left lower quadrant pain: Secondary | ICD-10-CM

## 2016-01-25 DIAGNOSIS — C787 Secondary malignant neoplasm of liver and intrahepatic bile duct: Secondary | ICD-10-CM | POA: Diagnosis present

## 2016-01-25 DIAGNOSIS — C189 Malignant neoplasm of colon, unspecified: Secondary | ICD-10-CM

## 2016-01-25 DIAGNOSIS — Z7982 Long term (current) use of aspirin: Secondary | ICD-10-CM

## 2016-01-25 DIAGNOSIS — I10 Essential (primary) hypertension: Secondary | ICD-10-CM | POA: Diagnosis present

## 2016-01-25 DIAGNOSIS — E876 Hypokalemia: Secondary | ICD-10-CM | POA: Diagnosis present

## 2016-01-25 DIAGNOSIS — F419 Anxiety disorder, unspecified: Secondary | ICD-10-CM | POA: Diagnosis present

## 2016-01-25 DIAGNOSIS — D62 Acute posthemorrhagic anemia: Secondary | ICD-10-CM | POA: Diagnosis present

## 2016-01-25 DIAGNOSIS — D6959 Other secondary thrombocytopenia: Secondary | ICD-10-CM | POA: Diagnosis present

## 2016-01-25 DIAGNOSIS — E7800 Pure hypercholesterolemia, unspecified: Secondary | ICD-10-CM | POA: Diagnosis present

## 2016-01-25 DIAGNOSIS — E8770 Fluid overload, unspecified: Secondary | ICD-10-CM

## 2016-01-25 DIAGNOSIS — K922 Gastrointestinal hemorrhage, unspecified: Secondary | ICD-10-CM | POA: Diagnosis present

## 2016-01-25 DIAGNOSIS — Z951 Presence of aortocoronary bypass graft: Secondary | ICD-10-CM

## 2016-01-25 DIAGNOSIS — I959 Hypotension, unspecified: Secondary | ICD-10-CM | POA: Diagnosis not present

## 2016-01-25 DIAGNOSIS — K219 Gastro-esophageal reflux disease without esophagitis: Secondary | ICD-10-CM | POA: Diagnosis present

## 2016-01-25 DIAGNOSIS — C7800 Secondary malignant neoplasm of unspecified lung: Secondary | ICD-10-CM | POA: Diagnosis present

## 2016-01-25 DIAGNOSIS — I8501 Esophageal varices with bleeding: Secondary | ICD-10-CM | POA: Diagnosis present

## 2016-01-25 DIAGNOSIS — R140 Abdominal distension (gaseous): Secondary | ICD-10-CM

## 2016-01-25 DIAGNOSIS — E785 Hyperlipidemia, unspecified: Secondary | ICD-10-CM | POA: Diagnosis present

## 2016-01-25 DIAGNOSIS — K5900 Constipation, unspecified: Secondary | ICD-10-CM | POA: Diagnosis present

## 2016-01-25 DIAGNOSIS — F17210 Nicotine dependence, cigarettes, uncomplicated: Secondary | ICD-10-CM | POA: Diagnosis present

## 2016-01-25 DIAGNOSIS — K4090 Unilateral inguinal hernia, without obstruction or gangrene, not specified as recurrent: Secondary | ICD-10-CM

## 2016-01-25 DIAGNOSIS — J9601 Acute respiratory failure with hypoxia: Secondary | ICD-10-CM | POA: Diagnosis present

## 2016-01-25 DIAGNOSIS — R748 Abnormal levels of other serum enzymes: Secondary | ICD-10-CM

## 2016-01-25 DIAGNOSIS — D696 Thrombocytopenia, unspecified: Secondary | ICD-10-CM

## 2016-01-25 DIAGNOSIS — H1010 Acute atopic conjunctivitis, unspecified eye: Secondary | ICD-10-CM | POA: Diagnosis present

## 2016-01-25 HISTORY — PX: ESOPHAGOGASTRODUODENOSCOPY: SHX5428

## 2016-01-25 HISTORY — DX: Malignant (primary) neoplasm, unspecified: C80.1

## 2016-01-25 LAB — CBC
HEMATOCRIT: 23.1 % — AB (ref 39.0–52.0)
HEMOGLOBIN: 7.4 g/dL — AB (ref 13.0–17.0)
MCH: 26.3 pg (ref 26.0–34.0)
MCHC: 32 g/dL (ref 30.0–36.0)
MCV: 82.2 fL (ref 78.0–100.0)
Platelets: 131 10*3/uL — ABNORMAL LOW (ref 150–400)
RBC: 2.81 MIL/uL — ABNORMAL LOW (ref 4.22–5.81)
RDW: 18.9 % — ABNORMAL HIGH (ref 11.5–15.5)
WBC: 10.9 10*3/uL — ABNORMAL HIGH (ref 4.0–10.5)

## 2016-01-25 LAB — PROTIME-INR
INR: 1.32
Prothrombin Time: 16.4 seconds — ABNORMAL HIGH (ref 11.4–15.2)

## 2016-01-25 LAB — APTT: aPTT: 29 seconds (ref 24–36)

## 2016-01-25 LAB — COMPREHENSIVE METABOLIC PANEL
ALBUMIN: 2.5 g/dL — AB (ref 3.5–5.0)
ALT: 32 U/L (ref 17–63)
ANION GAP: 6 (ref 5–15)
AST: 37 U/L (ref 15–41)
Alkaline Phosphatase: 111 U/L (ref 38–126)
BUN: 15 mg/dL (ref 6–20)
CO2: 19 mmol/L — AB (ref 22–32)
Calcium: 7.7 mg/dL — ABNORMAL LOW (ref 8.9–10.3)
Chloride: 112 mmol/L — ABNORMAL HIGH (ref 101–111)
Creatinine, Ser: 0.89 mg/dL (ref 0.61–1.24)
GFR calc Af Amer: >60 mL/min (ref >60)
GFR calc non Af Amer: >60 mL/min (ref >60)
GLUCOSE: 274 mg/dL — AB (ref 65–99)
POTASSIUM: 3.8 mmol/L (ref 3.5–5.1)
SODIUM: 137 mmol/L (ref 135–145)
Total Bilirubin: 1.2 mg/dL (ref 0.3–1.2)
Total Protein: 4.8 g/dL — ABNORMAL LOW (ref 6.5–8.1)

## 2016-01-25 LAB — ABO/RH: ABO/RH(D): A POS

## 2016-01-25 LAB — PREPARE RBC (CROSSMATCH)

## 2016-01-25 LAB — LIPASE, BLOOD: Lipase: 77 U/L — ABNORMAL HIGH (ref 11–51)

## 2016-01-25 SURGERY — EGD (ESOPHAGOGASTRODUODENOSCOPY)
Anesthesia: Moderate Sedation

## 2016-01-25 MED ORDER — OCTREOTIDE LOAD VIA INFUSION
50.0000 ug | Freq: Once | INTRAVENOUS | Status: AC
  Administered 2016-01-25: 50 ug via INTRAVENOUS
  Filled 2016-01-25: qty 25

## 2016-01-25 MED ORDER — SODIUM CHLORIDE 0.9 % IV SOLN
10.0000 mL/h | Freq: Once | INTRAVENOUS | Status: AC
  Administered 2016-01-26: 10 mL/h via INTRAVENOUS

## 2016-01-25 MED ORDER — FENTANYL CITRATE (PF) 100 MCG/2ML IJ SOLN
25.0000 ug | Freq: Once | INTRAMUSCULAR | Status: AC
  Administered 2016-01-25: 25 ug via INTRAVENOUS
  Filled 2016-01-25: qty 2

## 2016-01-25 MED ORDER — SODIUM CHLORIDE 0.9 % IV SOLN
8.0000 mg/h | INTRAVENOUS | Status: DC
  Administered 2016-01-26: 8 mg/h via INTRAVENOUS
  Filled 2016-01-25 (×3): qty 80

## 2016-01-25 MED ORDER — EPINEPHRINE PF 1 MG/10ML IJ SOSY
PREFILLED_SYRINGE | INTRAMUSCULAR | Status: AC
  Filled 2016-01-25: qty 10

## 2016-01-25 MED ORDER — BUTAMBEN-TETRACAINE-BENZOCAINE 2-2-14 % EX AERO
INHALATION_SPRAY | CUTANEOUS | Status: DC | PRN
  Administered 2016-01-25: 2 via TOPICAL

## 2016-01-25 MED ORDER — OCTREOTIDE ACETATE 500 MCG/ML IJ SOLN
50.0000 ug/h | INTRAMUSCULAR | Status: DC
  Administered 2016-01-26 – 2016-01-28 (×6): 50 ug/h via INTRAVENOUS
  Filled 2016-01-25 (×13): qty 1

## 2016-01-25 MED ORDER — FENTANYL CITRATE (PF) 100 MCG/2ML IJ SOLN
INTRAMUSCULAR | Status: AC
  Filled 2016-01-25: qty 2

## 2016-01-25 MED ORDER — MIDAZOLAM HCL 5 MG/ML IJ SOLN
INTRAMUSCULAR | Status: AC
  Filled 2016-01-25: qty 2

## 2016-01-25 MED ORDER — FENTANYL CITRATE (PF) 100 MCG/2ML IJ SOLN
INTRAMUSCULAR | Status: DC | PRN
  Administered 2016-01-25: 25 ug via INTRAVENOUS

## 2016-01-25 MED ORDER — MIDAZOLAM HCL 10 MG/2ML IJ SOLN
INTRAMUSCULAR | Status: DC | PRN
  Administered 2016-01-25: 2 mg via INTRAVENOUS
  Administered 2016-01-26 (×3): 1 mg via INTRAVENOUS

## 2016-01-25 MED ORDER — SODIUM CHLORIDE 0.9 % IV BOLUS (SEPSIS)
1000.0000 mL | Freq: Once | INTRAVENOUS | Status: AC
Start: 2016-01-25 — End: 2016-01-26
  Administered 2016-01-25: 1000 mL via INTRAVENOUS

## 2016-01-25 MED ORDER — SODIUM CHLORIDE 0.9 % IV SOLN
80.0000 mg | Freq: Once | INTRAVENOUS | Status: AC
  Administered 2016-01-26: 80 mg via INTRAVENOUS
  Filled 2016-01-25: qty 80

## 2016-01-25 NOTE — ED Notes (Signed)
Bed: [id number] Expected date:  Expected time:  Means of arrival:  Comments: EMS nausea and vomiting

## 2016-01-25 NOTE — ED Provider Notes (Signed)
[facility] DEPT Provider Note   CSN: [id number] Arrival date & time: 01/25/16  2112     History   Chief Complaint Chief Complaint  Patient presents with  . Hematemesis    HPI James Russell is a 55 y.o. male.  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 He is currently undergoing chemotherapy at [facility] after 2 episodes of large amount of hematemesis. Described as bright red blood. Patient became lightheaded and felt as if he were going to passed out. Admits to right upper quadrant pain and then developed right-sided chest pain. Was given nitroglycerin with no improvement of his chest pain. Given aspirin by EMS with another episode of hematemesis. Patient takes Plavix and aspirin for known coronary artery disease with stenting.  Past Medical History:  Diagnosis Date  . Cancer ([place])    liver  . H. pylori infection   . Hypercholesteremia   . Hypertension   . Iron deficiency   . MI (myocardial infarction)     There are no active problems to display for this patient.   Past Surgical History:  Procedure Laterality Date  . CORONARY ARTERY BYPASS GRAFT    . implanted port         Home Medications    Prior to Admission medications   Medication Sig Start Date End Date Taking? Authorizing Provider  ALPRAZolam Duanne Moron) 0.5 MG tablet Take 0.5 mg by mouth at bedtime as needed for anxiety.   Yes Historical Provider, MD  amLODipine (NORVASC) 10 MG tablet Take 10 mg by mouth daily.   Yes Historical Provider, MD  aspirin 81 MG chewable tablet Chew 81 mg by mouth daily.   Yes Historical Provider, MD  atenolol (TENORMIN) 100 MG tablet Take 50 mg by mouth daily.   Yes Historical Provider, MD  atorvastatin (LIPITOR) 80 MG tablet Take 80 mg by mouth daily.   Yes Historical Provider, MD  nitroGLYCERIN (NITROSTAT) 0.4 MG SL tablet Place 0.4 mg under the tongue every 5 (five) minutes as needed for chest pain.   Yes Historical Provider, MD    omeprazole (PRILOSEC) 40 MG capsule Take 40 mg by mouth daily.   Yes Historical Provider, MD  ondansetron (ZOFRAN-ODT) 8 MG disintegrating tablet Take 8 mg by mouth every 8 (eight) hours as needed for nausea or vomiting.   Yes Historical Provider, MD  oxyCODONE (OXY IR/ROXICODONE) 5 MG immediate release tablet Take 10 mg by mouth every 4 (four) hours as needed for severe pain.   Yes Historical Provider, MD  pregabalin (LYRICA) 75 MG capsule Take 75 mg by mouth 2 (two) times daily.   Yes Historical Provider, MD  ramipril (ALTACE) 5 MG capsule Take 10 mg by mouth 2 (two) times daily.   Yes Historical Provider, MD  zolpidem (AMBIEN) 10 MG tablet Take 10 mg by mouth at bedtime as needed for sleep.   Yes Historical Provider, MD  ciprofloxacin (CIPRO) 500 MG tablet Take 1 tablet (500 mg total) by mouth 2 (two) times daily. One po bid x 7 days Patient not taking: Reported on 01/25/2016 04/28/13   Evelina Bucy, MD  docusate sodium (COLACE) 100 MG capsule Take 1 capsule (100 mg total) by mouth every 12 (twelve) hours. Patient not taking: Reported on 01/25/2016 04/28/13   Evelina Bucy, MD  metroNIDAZOLE (FLAGYL) 500 MG tablet Take 1 tablet (500 mg total) by mouth 2 (two) times daily. One po bid x 7 days Patient not taking: Reported on 01/25/2016  04/28/13   Evelina Bucy, MD  oxyCODONE-acetaminophen (PERCOCET) 5-325 MG per tablet Take 1 tablet by mouth every 6 (six) hours as needed for moderate pain. Patient not taking: Reported on 01/25/2016 04/28/13   Evelina Bucy, MD    Family History History reviewed. No pertinent family history.  Social History Social History  Substance Use Topics  . Smoking status: Current Some Day Smoker  . Smokeless tobacco: Never Used  . Alcohol use No     Allergies   Tylenol [acetaminophen]   Review of Systems Review of Systems  Constitutional: Negative for chills and fever.  Respiratory: Negative for shortness of breath.   Cardiovascular: Positive for chest pain.  Negative for palpitations and leg swelling.  Gastrointestinal: Positive for abdominal pain, nausea and vomiting. Negative for blood in stool and diarrhea.  Genitourinary: Negative for dysuria, flank pain, frequency and hematuria.  Musculoskeletal: Negative for back pain, myalgias, neck pain and neck stiffness.  Skin: Negative for rash and wound.  Neurological: Positive for dizziness and light-headedness. Negative for syncope, weakness, numbness and headaches.  All other systems reviewed and are negative.    Physical Exam Updated Vital Signs BP 117/68   Pulse 98   Temp 97.3 F (36.3 C) (Oral)   Resp 20   Ht 5\' 11"  (1.803 m)   Wt 185 lb (83.9 kg)   SpO2 100%   BMI 25.80 kg/m   Physical Exam  Constitutional: He is oriented to person, place, and time. He appears well-developed and well-nourished. No distress.  Pale appearing  HENT:  Head: Normocephalic and atraumatic.  Mouth/Throat: Oropharynx is clear and moist.  Eyes: EOM are normal. Pupils are equal, round, and reactive to light.  Neck: Normal range of motion. Neck supple.  Cardiovascular: Normal rate and regular rhythm.  Exam reveals no gallop and no friction rub.   No murmur heard. Pulmonary/Chest: Effort normal and breath sounds normal. No respiratory distress. He has no wheezes. He has no rales. He exhibits no tenderness.  Abdominal: Soft. Bowel sounds are normal. He exhibits no distension. There is no tenderness. There is no rebound and no guarding.  Musculoskeletal: Normal range of motion. He exhibits no edema or tenderness.  Thready peripheral pulses. No lower extremity swelling or asymmetry.  Neurological: He is alert and oriented to person, place, and time.  Awake and alert. Moving all extremities without deficit. Sensation intact.  Skin: Skin is warm and dry. Capillary refill takes less than 2 seconds. No rash noted. No erythema. There is pallor.  Psychiatric: He has a normal mood and affect. His behavior is normal.    Nursing note and vitals reviewed.    ED Treatments / Results  Labs (all labs ordered are listed, but only abnormal results are displayed) Labs Reviewed  LIPASE, BLOOD - Abnormal; Notable for the following:       Result Value   Lipase 77 (*)    All other components within normal limits  COMPREHENSIVE METABOLIC PANEL - Abnormal; Notable for the following:    Chloride 112 (*)    CO2 19 (*)    Glucose, Bld 274 (*)    Calcium 7.7 (*)    Total Protein 4.8 (*)    Albumin 2.5 (*)    All other components within normal limits  CBC - Abnormal; Notable for the following:    WBC 10.9 (*)    RBC 2.81 (*)    Hemoglobin 7.4 (*)    HCT 23.1 (*)    RDW 18.9 (*)  Platelets 131 (*)    All other components within normal limits  PROTIME-INR - Abnormal; Notable for the following:    Prothrombin Time 16.4 (*)    All other components within normal limits  APTT  URINALYSIS, ROUTINE W REFLEX MICROSCOPIC (NOT AT [facility])  I-STAT TROPOININ, ED  PREPARE RBC (CROSSMATCH)  TYPE AND SCREEN  ABO/RH    EKG  EKG Interpretation  Date/Time:  Thursday January 25 2016 22:19:49 EDT Ventricular Rate:  90 PR Interval:    QRS Duration: 86 QT Interval:  373 QTC Calculation: 457 R Axis:   8 Text Interpretation:  Sinus rhythm Confirmed by Lita Mains  MD, Julienne Vogler ([id number]) on 01/25/2016 11:08:25 PM       Radiology Dg Chest Port 1 View  Result Date: 01/25/2016 CLINICAL DATA:  Anterior chest pain and vomiting. EXAM: PORTABLE CHEST 1 VIEW COMPARISON:  Chest radiograph 05/28/2008 FINDINGS: There is a dual-lumen right chest wall Port-A-Cath with tip overlying the proximal right atrium. There is shallow lung inflation without focal consolidation or pulmonary edema. No pneumothorax or pleural effusion. Cardiomediastinal contours are normal. IMPRESSION: Clear lungs. Electronically Signed   By: Ulyses Jarred M.D.   On: 01/25/2016 22:45    Procedures Procedures (including critical care time)  Medications Ordered  in ED Medications  0.9 %  sodium chloride infusion ( Intravenous MAR Unhold 01/26/16 0032)  pantoprazole (PROTONIX) 80 mg in sodium chloride 0.9 % 100 mL IVPB ( Intravenous MAR Unhold 01/26/16 0032)  pantoprazole (PROTONIX) 80 mg in sodium chloride 0.9 % 250 mL (0.32 mg/mL) infusion (not administered)  octreotide (SANDOSTATIN) 2 mcg/mL load via infusion 50 mcg (50 mcg Intravenous Bolus from Bag 01/25/16 2308)    And  octreotide (SANDOSTATIN) 500 mcg in sodium chloride 0.9 % 250 mL (2 mcg/mL) infusion (not administered)  sodium chloride 0.9 % bolus 1,000 mL (1,000 mLs Intravenous New Bag/Given 01/25/16 2233)  fentaNYL (SUBLIMAZE) injection 25 mcg (25 mcg Intravenous Given 01/25/16 2233)  fentaNYL (SUBLIMAZE) injection 25 mcg (25 mcg Intravenous Given 01/25/16 2334)   CRITICAL CARE Performed by: Lita Mains, Cartier Mapel Total critical care time: 45 minutes Critical care time was exclusive of separately billable procedures and treating other patients. Critical care was necessary to treat or prevent imminent or life-threatening deterioration. Critical care was time spent personally by me on the following activities: development of treatment plan with patient and/or surrogate as well as nursing, discussions with consultants, evaluation of patient's response to treatment, examination of patient, obtaining history from patient or surrogate, ordering and performing treatments and interventions, ordering and review of laboratory studies, ordering and review of radiographic studies, pulse oximetry and re-evaluation of patient's condition.  Initial Impression / Assessment and Plan / ED Course  I have reviewed the triage vital signs and the nursing notes.  Pertinent labs & imaging results that were available during my care of the patient were reviewed by me and considered in my medical decision making (see chart for details).  Clinical Course   Discussed with Dr. Amedeo Plenty. We'll see patient in the emergency  department for emergent endoscopy. Patient was typed and screened and will be transfused. Blood pressures improved with IV fluids. No further vomiting. Discussed also with critical care. Will evaluate patient  Endoscopy in the emergency department. Active bleeding noted. Standing place. Patient does not have any active bleeding at this time.. Blood pressure is stable.. Critical care will admit to ICU. Final Clinical Impressions(s) / ED Diagnoses   Final diagnoses:  Hematemesis with nausea    New  Prescriptions New Prescriptions   No medications on file     Julianne Rice, MD 01/26/16 [phone]

## 2016-01-25 NOTE — ED Notes (Signed)
RN charge d/c troponin at present time.

## 2016-01-25 NOTE — ED Triage Notes (Signed)
Pt comes from home via EMS with complaints of vomiting blood x2.  Dark blood present in emesis. Per EMS he has been receiving chemo for liver cancer for the past few years.  Endorses dizziness upon standing and RUQ pain.

## 2016-01-25 NOTE — Consult Note (Signed)
[facility] Consult Note  Referring Provider: No ref. provider found Primary Care Physician:  Pcp Not In System Primary Gastroenterologist:  Dr.  Laurel Dimmer Complaint: Vomiting blood HPI: James Russell is an 55 y.o. white male  with recently diagnosed colon cancer with metastases to the liver currently undergoing chemotherapy, felt acutely nauseated and weak with mild abdominal pain while eating supper this evening followed by frank hematemesis which was photographed and occurred at home and 2 episodes. He has not had any vomiting in the emergency room but was initially hypotensive with a hemoglobin of 7. He had a normal platelet count. He was resuscitated with IV fluids and packed red blood cells were ordered and he was administered Protonix and octreotide. He had an endoscopy during the workup for his colon cancer which he thinks was unremarkable. He has no known history of liver disease and no history of GI bleeding. He is not on any anticoagulants except for 325 mg aspirin a day.  Past Medical History:  Diagnosis Date  . Cancer ([place])    liver  . H. pylori infection   . Hypercholesteremia   . Hypertension   . Iron deficiency   . MI (myocardial infarction)     Past Surgical History:  Procedure Laterality Date  . CORONARY ARTERY BYPASS GRAFT    . implanted port       (Not in a hospital admission)  Allergies:  Allergies  Allergen Reactions  . Tylenol [Acetaminophen]     Chills    History reviewed. No pertinent family history.  Social History:  reports that he has been smoking.  He has never used smokeless tobacco. He reports that he does not drink alcohol or use drugs.  Review of Systems: negative except As above   Blood pressure (!) 109/51, pulse 81, temperature 97.7 F (36.5 C), temperature source Oral, resp. rate 18, height '5\' 11"'$  (1.803 m), weight 83.9 kg (185 lb), SpO2 99 %. Head: Normocephalic, without obvious abnormality, atraumatic Neck: no adenopathy,  no carotid bruit, no JVD, supple, symmetrical, trachea midline and thyroid not enlarged, symmetric, no tenderness/mass/nodules Resp: clear to auscultation bilaterally Cardio: regular rate and rhythm, S1, S2 normal, no murmur, click, rub or gallop GI: Abdomen soft mildly tender in the epigastrium no organomegaly Extremities: extremities normal, atraumatic, no cyanosis or edema  Results for orders placed or performed during the hospital encounter of 01/25/16 (from the past 48 hour(s))  Lipase, blood     Status: Abnormal   Collection Time: 01/25/16  9:34 PM  Result Value Ref Range   Lipase 77 (H) 11 - 51 U/L  Comprehensive metabolic panel     Status: Abnormal   Collection Time: 01/25/16  9:34 PM  Result Value Ref Range   Sodium 137 135 - 145 mmol/L   Potassium 3.8 3.5 - 5.1 mmol/L   Chloride 112 (H) 101 - 111 mmol/L   CO2 19 (L) 22 - 32 mmol/L   Glucose, Bld 274 (H) 65 - 99 mg/dL   BUN 15 6 - 20 mg/dL   Creatinine, Ser 0.89 0.61 - 1.24 mg/dL   Calcium 7.7 (L) 8.9 - 10.3 mg/dL   Total Protein 4.8 (L) 6.5 - 8.1 g/dL   Albumin 2.5 (L) 3.5 - 5.0 g/dL   AST 37 15 - 41 U/L   ALT 32 17 - 63 U/L   Alkaline Phosphatase 111 38 - 126 U/L   Total Bilirubin 1.2 0.3 - 1.2 mg/dL   GFR calc non Af Amer >60 >  60 mL/min   GFR calc Af Amer >60 >60 mL/min    Comment: (NOTE) The eGFR has been calculated using the CKD EPI equation. This calculation has not been validated in all clinical situations. eGFR's persistently <60 mL/min signify possible Chronic Kidney Disease.    Anion gap 6 5 - 15  CBC     Status: Abnormal   Collection Time: 01/25/16  9:34 PM  Result Value Ref Range   WBC 10.9 (H) 4.0 - 10.5 K/uL   RBC 2.81 (L) 4.22 - 5.81 MIL/uL   Hemoglobin 7.4 (L) 13.0 - 17.0 g/dL   HCT 23.1 (L) 39.0 - 52.0 %   MCV 82.2 78.0 - 100.0 fL   MCH 26.3 26.0 - 34.0 pg   MCHC 32.0 30.0 - 36.0 g/dL   RDW 18.9 (H) 11.5 - 15.5 %   Platelets 131 (L) 150 - 400 K/uL  APTT     Status: None   Collection Time:  01/25/16  9:35 PM  Result Value Ref Range   aPTT 29 24 - 36 seconds  Protime-INR     Status: Abnormal   Collection Time: 01/25/16  9:35 PM  Result Value Ref Range   Prothrombin Time 16.4 (H) 11.4 - 15.2 seconds   INR 1.32   Prepare RBC     Status: None   Collection Time: 01/25/16 10:18 PM  Result Value Ref Range   Order Confirmation ORDER PROCESSED BY BLOOD BANK   Type and screen     Status: None (Preliminary result)   Collection Time: 01/25/16 10:45 PM  Result Value Ref Range   ABO/RH(D) A POS    Antibody Screen NEG    Sample Expiration 01/28/2016    Unit Number [id number]    Blood Component Type RED CELLS,LR    Unit division 00    Status of Unit ALLOCATED    Transfusion Status OK TO TRANSFUSE    Crossmatch Result Compatible    Unit Number [id number]    Blood Component Type RED CELLS,LR    Unit division 00    Status of Unit ALLOCATED    Transfusion Status OK TO TRANSFUSE    Crossmatch Result Compatible   ABO/Rh     Status: None   Collection Time: 01/25/16 10:45 PM  Result Value Ref Range   ABO/RH(D) A POS    Dg Chest Port 1 View  Result Date: 01/25/2016 CLINICAL DATA:  Anterior chest pain and vomiting. EXAM: PORTABLE CHEST 1 VIEW COMPARISON:  Chest radiograph 05/28/2008 FINDINGS: There is a dual-lumen right chest wall Port-A-Cath with tip overlying the proximal right atrium. There is shallow lung inflation without focal consolidation or pulmonary edema. No pneumothorax or pleural effusion. Cardiomediastinal contours are normal. IMPRESSION: Clear lungs. Electronically Signed   By: Ulyses Jarred M.D.   On: 01/25/2016 22:45    Assessment: Acute upper GI bleeding differential diagnosis, peptic ulcer disease, esophagogastric varices or Mallory-Weiss tear Plan:  Continue current pharmacologic and supportive measures and will pursue urgent EGD tonight. Earlie Arciga C 01/25/2016, 11:45 PM  Pager [phone] If no answer or after 5 PM call [phone]

## 2016-01-26 ENCOUNTER — Encounter (HOSPITAL_COMMUNITY): Payer: Self-pay | Admitting: Gastroenterology

## 2016-01-26 ENCOUNTER — Inpatient Hospital Stay (HOSPITAL_COMMUNITY): Payer: PRIVATE HEALTH INSURANCE

## 2016-01-26 DIAGNOSIS — I8511 Secondary esophageal varices with bleeding: Secondary | ICD-10-CM | POA: Diagnosis not present

## 2016-01-26 DIAGNOSIS — Z9221 Personal history of antineoplastic chemotherapy: Secondary | ICD-10-CM | POA: Diagnosis not present

## 2016-01-26 DIAGNOSIS — J9601 Acute respiratory failure with hypoxia: Secondary | ICD-10-CM | POA: Diagnosis present

## 2016-01-26 DIAGNOSIS — K5900 Constipation, unspecified: Secondary | ICD-10-CM | POA: Diagnosis present

## 2016-01-26 DIAGNOSIS — D6959 Other secondary thrombocytopenia: Secondary | ICD-10-CM | POA: Diagnosis present

## 2016-01-26 DIAGNOSIS — G620 Drug-induced polyneuropathy: Secondary | ICD-10-CM | POA: Diagnosis present

## 2016-01-26 DIAGNOSIS — F419 Anxiety disorder, unspecified: Secondary | ICD-10-CM | POA: Diagnosis present

## 2016-01-26 DIAGNOSIS — I10 Essential (primary) hypertension: Secondary | ICD-10-CM | POA: Diagnosis present

## 2016-01-26 DIAGNOSIS — C189 Malignant neoplasm of colon, unspecified: Secondary | ICD-10-CM

## 2016-01-26 DIAGNOSIS — K219 Gastro-esophageal reflux disease without esophagitis: Secondary | ICD-10-CM | POA: Diagnosis present

## 2016-01-26 DIAGNOSIS — G9340 Encephalopathy, unspecified: Secondary | ICD-10-CM | POA: Insufficient documentation

## 2016-01-26 DIAGNOSIS — I8501 Esophageal varices with bleeding: Secondary | ICD-10-CM | POA: Diagnosis present

## 2016-01-26 DIAGNOSIS — K922 Gastrointestinal hemorrhage, unspecified: Secondary | ICD-10-CM | POA: Diagnosis not present

## 2016-01-26 DIAGNOSIS — D62 Acute posthemorrhagic anemia: Secondary | ICD-10-CM | POA: Diagnosis present

## 2016-01-26 DIAGNOSIS — K766 Portal hypertension: Secondary | ICD-10-CM | POA: Diagnosis present

## 2016-01-26 DIAGNOSIS — H1010 Acute atopic conjunctivitis, unspecified eye: Secondary | ICD-10-CM | POA: Diagnosis present

## 2016-01-26 DIAGNOSIS — R188 Other ascites: Secondary | ICD-10-CM | POA: Diagnosis present

## 2016-01-26 DIAGNOSIS — E876 Hypokalemia: Secondary | ICD-10-CM | POA: Diagnosis present

## 2016-01-26 DIAGNOSIS — C7800 Secondary malignant neoplasm of unspecified lung: Secondary | ICD-10-CM | POA: Diagnosis present

## 2016-01-26 DIAGNOSIS — Z951 Presence of aortocoronary bypass graft: Secondary | ICD-10-CM | POA: Diagnosis not present

## 2016-01-26 DIAGNOSIS — K4090 Unilateral inguinal hernia, without obstruction or gangrene, not specified as recurrent: Secondary | ICD-10-CM | POA: Diagnosis present

## 2016-01-26 DIAGNOSIS — E785 Hyperlipidemia, unspecified: Secondary | ICD-10-CM | POA: Diagnosis present

## 2016-01-26 DIAGNOSIS — Z7982 Long term (current) use of aspirin: Secondary | ICD-10-CM | POA: Diagnosis not present

## 2016-01-26 DIAGNOSIS — E7800 Pure hypercholesterolemia, unspecified: Secondary | ICD-10-CM | POA: Diagnosis present

## 2016-01-26 DIAGNOSIS — K920 Hematemesis: Secondary | ICD-10-CM | POA: Diagnosis present

## 2016-01-26 DIAGNOSIS — C787 Secondary malignant neoplasm of liver and intrahepatic bile duct: Secondary | ICD-10-CM | POA: Diagnosis present

## 2016-01-26 DIAGNOSIS — F17210 Nicotine dependence, cigarettes, uncomplicated: Secondary | ICD-10-CM | POA: Diagnosis present

## 2016-01-26 DIAGNOSIS — T451X5A Adverse effect of antineoplastic and immunosuppressive drugs, initial encounter: Secondary | ICD-10-CM | POA: Diagnosis present

## 2016-01-26 DIAGNOSIS — I959 Hypotension, unspecified: Secondary | ICD-10-CM | POA: Insufficient documentation

## 2016-01-26 LAB — GLUCOSE, CAPILLARY
GLUCOSE-CAPILLARY: 166 mg/dL — AB (ref 65–99)
GLUCOSE-CAPILLARY: 166 mg/dL — AB (ref 65–99)
GLUCOSE-CAPILLARY: 164 mg/dL — AB (ref 65–99)
Glucose-Capillary: 141 mg/dL — ABNORMAL HIGH (ref 65–99)
Glucose-Capillary: 159 mg/dL — ABNORMAL HIGH (ref 65–99)
Glucose-Capillary: 188 mg/dL — ABNORMAL HIGH (ref 65–99)

## 2016-01-26 LAB — LACTIC ACID, PLASMA: LACTIC ACID, VENOUS: 1.7 mmol/L (ref 0.5–1.9)

## 2016-01-26 LAB — URINALYSIS, ROUTINE W REFLEX MICROSCOPIC
BILIRUBIN URINE: NEGATIVE
Glucose, UA: 250 mg/dL — AB
Hgb urine dipstick: NEGATIVE
Ketones, ur: NEGATIVE mg/dL
LEUKOCYTES UA: NEGATIVE
NITRITE: NEGATIVE
PH: 6 (ref 5.0–8.0)
Protein, ur: NEGATIVE mg/dL
SPECIFIC GRAVITY, URINE: 1.024 (ref 1.005–1.030)

## 2016-01-26 LAB — BASIC METABOLIC PANEL
ANION GAP: 2 — AB (ref 5–15)
Anion gap: 3 — ABNORMAL LOW (ref 5–15)
BUN: 15 mg/dL (ref 6–20)
BUN: 17 mg/dL (ref 6–20)
CALCIUM: 7 mg/dL — AB (ref 8.9–10.3)
CHLORIDE: 114 mmol/L — AB (ref 101–111)
CO2: 19 mmol/L — AB (ref 22–32)
CO2: 20 mmol/L — ABNORMAL LOW (ref 22–32)
CREATININE: 0.76 mg/dL (ref 0.61–1.24)
Calcium: 7.2 mg/dL — ABNORMAL LOW (ref 8.9–10.3)
Chloride: 114 mmol/L — ABNORMAL HIGH (ref 101–111)
Creatinine, Ser: 0.77 mg/dL (ref 0.61–1.24)
GFR calc Af Amer: >60 mL/min (ref >60)
GFR calc non Af Amer: >60 mL/min (ref >60)
GLUCOSE: 180 mg/dL — AB (ref 65–99)
GLUCOSE: 158 mg/dL — AB (ref 65–99)
Potassium: 5.2 mmol/L — ABNORMAL HIGH (ref 3.5–5.1)
Potassium: 4.3 mmol/L (ref 3.5–5.1)
SODIUM: 136 mmol/L (ref 135–145)
Sodium: 136 mmol/L (ref 135–145)

## 2016-01-26 LAB — MRSA PCR SCREENING: MRSA by PCR: NEGATIVE

## 2016-01-26 LAB — MAGNESIUM: Magnesium: 1.7 mg/dL (ref 1.7–2.4)

## 2016-01-26 LAB — PHOSPHORUS: PHOSPHORUS: 2.7 mg/dL (ref 2.5–4.6)

## 2016-01-26 LAB — HEMOGLOBIN AND HEMATOCRIT, BLOOD
HCT: 18.7 % — ABNORMAL LOW (ref 39.0–52.0)
HCT: 24.5 % — ABNORMAL LOW (ref 39.0–52.0)
HEMATOCRIT: 23.2 % — AB (ref 39.0–52.0)
HEMOGLOBIN: 5.9 g/dL — AB (ref 13.0–17.0)
Hemoglobin: 7.8 g/dL — ABNORMAL LOW (ref 13.0–17.0)
Hemoglobin: 8.1 g/dL — ABNORMAL LOW (ref 13.0–17.0)

## 2016-01-26 LAB — CBG MONITORING, ED: GLUCOSE-CAPILLARY: 212 mg/dL — AB (ref 65–99)

## 2016-01-26 LAB — TROPONIN I

## 2016-01-26 MED ORDER — SODIUM CHLORIDE 0.9 % IV SOLN: 250.0000 mL | INTRAVENOUS | Status: DC | PRN

## 2016-01-26 MED ORDER — SODIUM CHLORIDE 0.9 % IV SOLN
INTRAVENOUS | Status: DC
  Administered 2016-01-26 (×2): via INTRAVENOUS
  Administered 2016-01-27: 1000 mL via INTRAVENOUS
  Administered 2016-01-27 – 2016-01-28 (×3): via INTRAVENOUS

## 2016-01-26 MED ORDER — CEFTRIAXONE SODIUM 1 G IJ SOLR
1.0000 g | INTRAMUSCULAR | Status: DC
  Administered 2016-01-26: 1 g via INTRAVENOUS
  Filled 2016-01-26 (×2): qty 10

## 2016-01-26 MED ORDER — SODIUM CHLORIDE 0.9% FLUSH
10.0000 mL | INTRAVENOUS | Status: DC | PRN
  Administered 2016-01-28: 10 mL
  Filled 2016-01-26: qty 40

## 2016-01-26 MED ORDER — ONDANSETRON HCL 4 MG/2ML IJ SOLN
4.0000 mg | Freq: Four times a day (QID) | INTRAMUSCULAR | Status: DC | PRN
  Administered 2016-01-26: 4 mg via INTRAVENOUS
  Filled 2016-01-26: qty 2

## 2016-01-26 MED ORDER — PANTOPRAZOLE SODIUM 40 MG IV SOLR
40.0000 mg | INTRAVENOUS | Status: DC
  Administered 2016-01-26 – 2016-01-28 (×3): 40 mg via INTRAVENOUS
  Filled 2016-01-26 (×3): qty 40

## 2016-01-26 MED ORDER — INSULIN ASPART 100 UNIT/ML ~~LOC~~ SOLN
0.0000 [IU] | SUBCUTANEOUS | Status: DC
  Administered 2016-01-26: 5 [IU] via SUBCUTANEOUS
  Administered 2016-01-26: 3 [IU] via SUBCUTANEOUS
  Administered 2016-01-27: 2 [IU] via SUBCUTANEOUS
  Administered 2016-01-27: 8 [IU] via SUBCUTANEOUS
  Administered 2016-01-27: 2 [IU] via SUBCUTANEOUS
  Administered 2016-01-27: 3 [IU] via SUBCUTANEOUS
  Administered 2016-01-28 (×2): 2 [IU] via SUBCUTANEOUS
  Administered 2016-01-28 (×2): 3 [IU] via SUBCUTANEOUS
  Administered 2016-01-28: 2 [IU] via SUBCUTANEOUS
  Administered 2016-01-28: 3 [IU] via SUBCUTANEOUS
  Administered 2016-01-29: 2 [IU] via SUBCUTANEOUS
  Administered 2016-01-29 (×2): 3 [IU] via SUBCUTANEOUS
  Administered 2016-01-29: 5 [IU] via SUBCUTANEOUS
  Administered 2016-01-29 (×2): 2 [IU] via SUBCUTANEOUS
  Administered 2016-01-30: 3 [IU] via SUBCUTANEOUS
  Administered 2016-01-30: 2 [IU] via SUBCUTANEOUS
  Administered 2016-01-30 – 2016-01-31 (×4): 3 [IU] via SUBCUTANEOUS
  Administered 2016-01-31: 2 [IU] via SUBCUTANEOUS
  Administered 2016-01-31: 5 [IU] via SUBCUTANEOUS
  Administered 2016-01-31: 2 [IU] via SUBCUTANEOUS
  Administered 2016-02-01: 5 [IU] via SUBCUTANEOUS
  Administered 2016-02-01: 2 [IU] via SUBCUTANEOUS
  Administered 2016-02-01: 3 [IU] via SUBCUTANEOUS
  Administered 2016-02-01: 5 [IU] via SUBCUTANEOUS
  Administered 2016-02-01 – 2016-02-02 (×3): 3 [IU] via SUBCUTANEOUS
  Filled 2016-01-26: qty 1

## 2016-01-26 MED ORDER — FENTANYL CITRATE (PF) 100 MCG/2ML IJ SOLN
25.0000 ug | INTRAMUSCULAR | Status: DC | PRN
  Administered 2016-01-26: 75 ug via INTRAVENOUS
  Administered 2016-01-26: 50 ug via INTRAVENOUS
  Administered 2016-01-26: 25 ug via INTRAVENOUS
  Administered 2016-01-26 (×2): 75 ug via INTRAVENOUS
  Administered 2016-01-26: 50 ug via INTRAVENOUS
  Administered 2016-01-26 (×2): 75 ug via INTRAVENOUS
  Administered 2016-01-26: 50 ug via INTRAVENOUS
  Administered 2016-01-27 (×9): 75 ug via INTRAVENOUS
  Filled 2016-01-26 (×18): qty 2

## 2016-01-26 MED ORDER — ORAL CARE MOUTH RINSE
15.0000 mL | Freq: Two times a day (BID) | OROMUCOSAL | Status: DC
  Administered 2016-01-27 – 2016-02-01 (×9): 15 mL via OROMUCOSAL

## 2016-01-26 NOTE — ED Notes (Signed)
Attempt x2 for ultrasound IV by this writer, attempt x 2 per Dr. Lita Mains and attempt x 1 per Dr. Jeneen Rinks, unsuccessful.

## 2016-01-26 NOTE — Op Note (Signed)
[facility] Patient Name: James Russell Procedure Date: 01/25/2016 MRN: [id number] Attending MD: Missy Sabins , MD Date of Birth: [birth year] CSN: [id number] Age: 55 Admit Type: Emergency Department Procedure:                Upper GI endoscopy Indications:              Hematemesis Providers:                Elyse Jarvis. Amedeo Plenty, MD, Vista Lawman, RN, Corliss Parish, Technician Referring MD:              Medicines:                Fentanyl 50 micrograms IV, Midazolam 4 mg IV Complications:            Moderate bleeding Estimated Blood Loss:     Estimated blood loss: 50 mL. Procedure:                Pre-Anesthesia Assessment:                           - Prior to the procedure, a History and Physical                            was performed, and patient medications and                            allergies were reviewed. The patient's tolerance of                            previous anesthesia was also reviewed. The risks                            and benefits of the procedure and the sedation                            options and risks were discussed with the patient.                            All questions were answered, and informed consent                            was obtained. Prior Anticoagulants: The patient has                            taken no previous anticoagulant or antiplatelet                            agents. ASA Grade Assessment: III - A patient with                            severe systemic disease. After reviewing the risks  and benefits, the patient was deemed in                            satisfactory condition to undergo the procedure.                           After obtaining informed consent, the endoscope was                            passed under direct vision. Throughout the                            procedure, the patient's blood pressure, pulse, and                            oxygen  saturations were monitored continuously. The                            [id number] [id number]) scope was introduced through the                            mouth, and advanced to the second part of duodenum.                            The upper GI endoscopy was technically difficult                            and complex due to excessive bleeding. The patient                            tolerated the procedure fairly well. Scope In: Scope Out: Findings:      Four columns of non-bleeding grade III varices were found in the middle       third of the esophagus and in the lower third of the esophagus,.       Stigmata of recent bleeding were evident and red wale signs were       present. Three bands were successfully placed with incomplete       eradication of varices. A slow ooze remained at the end of the procedure.      Clotted blood was found in the stomach.      A large amount of food (residue) was found in the gastric body.      The in the duodenum was normal. Impression:               - Recently bleeding grade III esophageal varices.                            Incompletely eradicated. Banded.                           - Clotted blood in the stomach.                           - A large amount of food (residue) in the stomach.                           -  Normal.                           - No specimens collected. Moderate Sedation:      Moderate (conscious) sedation was administered by the endoscopy nurse       and supervised by the endoscopist. The following parameters were       monitored: oxygen saturation, heart rate, blood pressure, respiratory       rate, EKG, adequacy of pulmonary ventilation, and response to care. Recommendation:           - Use Protonix (pantoprazole) 40 mg IV daily.                           - Administer an IV bolus of 50 micrograms of                            octreotide followed by an infusion of 50 micrograms                            per hour.                            - Admit the patient to ICU for ongoing care.                           - Repeat upper endoscopy PRN for retreatment.                           - Make the patient NPO starting today.                           - Continue present medications. Procedure Code(s):        --- Professional ---                           [phone], Esophagogastroduodenoscopy, flexible,                            transoral; with band ligation of esophageal/gastric                            varices Diagnosis Code(s):        --- Professional ---                           I85.01, Esophageal varices with bleeding                           K92.2, Gastrointestinal hemorrhage, unspecified                           K92.0, Hematemesis CPT copyright 2016 American [employer] Association. All rights reserved. The codes documented in this report are preliminary and upon coder review may  be revised to meet current compliance requirements. Missy Sabins, MD 01/26/2016 12:36:49 AM This report has been signed electronically. Number of Addenda: 0

## 2016-01-26 NOTE — ED Notes (Signed)
Endoscopy being performed at this time

## 2016-01-26 NOTE — Progress Notes (Signed)
[facility] Gastroenterology Progress Note  James Russell 55 y.o. 08/12/1960  CC:  Follow-up for variceal bleeding   Subjective: Patient denied any vomiting or bowel movement since admission. . Drop in hemoglobin noted. Mild epigastric discomfort  ROS : Negative for diarrhea. Positive for mild epigastric discomfort   Objective: Vital signs in last 24 hours: Vitals:   01/26/16 0600 01/26/16 0800  BP: (!) 110/54   Pulse: 88   Resp: 15   Temp:  98.8 F (37.1 C)    Physical Exam:  General:  Alert, cooperative, no distress,   Head:  Normocephalic, without obvious abnormality, atraumatic  Eyes:  , EOM's intact,   Lungs:   Clear to auscultation bilaterally, respirations unlabored  Heart:  Regular rate and rhythm, S1, S2 normal  Abdomen:   Mild distended, Mild Epigastric TTP, soft, BS +   Extremities:  no  edema    Lab Results:  Recent Labs  01/25/16 2134 01/26/16 0335  NA 137 136  K 3.8 5.2*  CL 112* 114*  CO2 19* 19*  GLUCOSE 274* 180*  BUN 15 15  CREATININE 0.89 0.76  CALCIUM 7.7* 7.2*  MG  --  1.7  PHOS  --  2.7    Recent Labs  01/25/16 2134  AST 37  ALT 32  ALKPHOS 111  BILITOT 1.2  PROT 4.8*  ALBUMIN 2.5*    Recent Labs  01/25/16 2134 01/26/16 0335  WBC 10.9*  --   HGB 7.4* 5.9*  HCT 23.1* 18.7*  MCV 82.2  --   PLT 131*  --     Recent Labs  01/25/16 2135  LABPROT 16.4*  INR 1.32      Assessment/Plan: - Acute variceal bleed. Status post EGD  with band ligation with incomplete eradication of varices  - Metastatic colon cancer diagnosed in March 2015 with multiple liver metastases. status post right hemicolectomy. Status post right hepatic radio embolization - 10/28/2014 - Portal hypertension with thromocytopenia. EGD showing varices. ? Underlying cirrhosis. MRI on 11/27/2015 did not mention of any cirrhosis showed multiple liver lesions.  Recommendations ------------------------- - Patient denied any hematemesis or black tarry stool  since admission  - start Rocephin for variceal bleed - Continue octreotide drip for total 72 hours - Transfuse as needed. Avoid over transfusion. Target Hemoglobin of around 7-8 - If evidence of rebleeding, consider repeat endoscopy or TIPS - Consider starting nonselective beta blocker once acute issues are resolved.  - Keep NPO for now   Otis Brace MD, [place] 01/26/2016, 8:40 AM  Pager [phone]  If no answer or after 5 PM call [phone]

## 2016-01-26 NOTE — ED Notes (Signed)
No respiratory or acute distress noted resting in bed call light in reach no reaction to medication noted. 

## 2016-01-26 NOTE — Progress Notes (Signed)
[facility] PCCM  S: Had EGD overnight, vomited this morning (non-bloody emesis), wants to eat, nauseaed  O: Vitals:   01/26/16 0835 01/26/16 0900 01/26/16 0902 01/26/16 0917  BP:  (!) 117/59 (!) 117/59   Pulse: 88 89 90 (!) 102  Resp: 18 18 18 16   Temp: 98.7 F (37.1 C)  98.5 F (36.9 C) 98.2 F (36.8 C)  TempSrc: Oral  Oral Oral  SpO2: 99% 97% 100% 100%  Weight:      Height:       2L [place]  Gen: no distress HENT: NCAT, OP clear PULM: CTA B, normal effort CV: RRR, systolic murmur noted, trace edema GI: BS+, soft, nontender Derm: no cyanosis or rash Psyche: normal mood and affect  Notes from overnight reviewed including endoscopy note Case discussed with his oncologist Dr. Nelda Marseille this morning Labs reviewed: CBC with Hgb 5.9 early this morning  Impression/Plan:   Upper GI bleed, esophageal varices: now hemodynamically stable, doesn't appear to be actively bleeding, continue monitoring in ICU, continue octreotide and pantoprazole infusions; NPO for now, but will check with GI later today if no bleeding and Hgb stable  Hemorrhagic anemia: repeat CBC post PRBC transfusion this morning  Stage IV colon cancer: [facility] Oncology notified, hold avastin, will need f/u with oncology post discharge  Will continue to monitor in ICU  Roselie Awkward, MD [place] P[facility]M Pager: [phone] Cell: [phone] After 3pm or if no response, call [phone]

## 2016-01-26 NOTE — Procedures (Signed)
Central Venous Catheter Insertion Procedure Note DOLPHUS CIRRITO [id number] [birth year]  Procedure: Insertion of Central Venous Catheter Indications: Assessment of intravascular volume, Drug and/or fluid administration and Frequent blood sampling  Procedure Details Consent: Risks of procedure as well as the alternatives and risks of each were explained to the (patient/caregiver).  Consent for procedure obtained. Time Out: Verified patient identification, verified procedure, site/side was marked, verified correct patient position, special equipment/implants available, medications/allergies/relevent history reviewed, required imaging and test results available.  Performed  Maximum sterile technique was used including antiseptics, cap, gloves, gown, hand hygiene, mask and sheet. Skin prep: Chlorhexidine; local anesthetic administered A antimicrobial bonded/coated triple lumen catheter was placed in the left internal jugular vein using the Seldinger technique.  Evaluation Blood flow good Complications: No apparent complications Patient did tolerate procedure well. Chest X-ray ordered to verify placement.  CXR: pending.  Procedure performed under direct ultrasound guidance for real time vessel cannulation.      Montey Hora, [place] [facility] Pager: [phone]  or [phone] 01/26/2016, 1:32 AM   Baltazar Apo, MD, PhD 01/26/2016, 6:25 AM [place] [facility] [phone] or if no answer [phone]

## 2016-01-26 NOTE — Care Management Note (Signed)
Case Management Note  Patient Details  Name: James Russell MRN: [id number] Date of Birth: 12-Sep-1960  Subjective/Objective:          Gi bleeding and anemia          Action/Plan: home   Expected Discharge Date:                  Expected Discharge Plan:  Home/Self Care  In-House Referral:     Discharge planning Services     Post Acute Care Choice:    Choice offered to:     DME Arranged:    DME Agency:     HH Arranged:    HH Agency:     Status of Service:  In process, will continue to follow  If discussed at Long Length of Stay Meetings, dates discussed:    Additional Comments:Date:  January 26, 2016 Chart reviewed for concurrent status and case management needs. Will continue to follow the patient for status change: Discharge Planning: following for needs Expected discharge date: [id number] Velva Harman, BSN, [place], [place]  Leeroy Cha, RN 01/26/2016, 10:02 AM

## 2016-01-26 NOTE — H&P (Signed)
PULMONARY / CRITICAL CARE MEDICINE   Name: James Russell MRN: [id number] DOB: February 06, 1961    ADMISSION DATE:  01/25/2016 CONSULTATION DATE:  01/25/16  REFERRING MD:  Lita Mains - EDP  CHIEF COMPLAINT:  Hematemesis  HISTORY OF PRESENT ILLNESS:  Pt is encephelopathic; therefore, this HPI is obtained from chart review. James Russell is a 55 y.o. male with PMH as outlined below including recently diagnosed Stage IV colon CA with mets to liver and lung, currently undergoing chemotherapy (Avastin/Xeloda under the care of Dr. Nelda Marseille at [facility].  Has also been on FOLFORI in the past).  He presented to [facility] ED 10/26 feeling weak and nauseated.  Symptoms began after eating supper earlier that evening.  He then had 2 episodes of hematemesis while at home.  He was brought to ED where he his Hgb was noted to be 7.  He was seen by Dr. Amedeo Plenty of GI and had emergent EGD performed which demonstrated 4 columns of recently bleeding grade III mid - distal esophageal varices along with clotted blood in the stomach..  He had 3 bands placed with incomplete eradication of the varices and slow ooze remained after the procedure.  He was started on PPI and octreotide infusions and PCCM was asked to admit to the ICU for ongoing care.  Of note, he had right hemicolectomy on 06/08/14 by Dr. Crisoforo Oxford at [facility].  [place]  PAST MEDICAL HISTORY :  He  has a past medical history of Cancer ([place]); H. pylori infection; Hypercholesteremia; Hypertension; Iron deficiency; and MI (myocardial infarction).  PAST SURGICAL HISTORY: He  has a past surgical history that includes Coronary artery bypass graft and implanted port.  Allergies  Allergen Reactions  . Tylenol [Acetaminophen]     Chills    No current facility-administered medications on file prior to encounter.    Current Outpatient Prescriptions on File Prior to Encounter  Medication Sig  . amLODipine (NORVASC) 10 MG tablet Take 10 mg by mouth daily.  Marland Kitchen atenolol (TENORMIN) 100 MG  tablet Take 50 mg by mouth daily.  . ciprofloxacin (CIPRO) 500 MG tablet Take 1 tablet (500 mg total) by mouth 2 (two) times daily. One po bid x 7 days (Patient not taking: Reported on 01/25/2016)  . docusate sodium (COLACE) 100 MG capsule Take 1 capsule (100 mg total) by mouth every 12 (twelve) hours. (Patient not taking: Reported on 01/25/2016)  . metroNIDAZOLE (FLAGYL) 500 MG tablet Take 1 tablet (500 mg total) by mouth 2 (two) times daily. One po bid x 7 days (Patient not taking: Reported on 01/25/2016)  . oxyCODONE-acetaminophen (PERCOCET) 5-325 MG per tablet Take 1 tablet by mouth every 6 (six) hours as needed for moderate pain. (Patient not taking: Reported on 01/25/2016)    FAMILY HISTORY:  His has no family status information on file.    SOCIAL HISTORY: He  reports that he has been smoking.  He has never used smokeless tobacco. He reports that he does not drink alcohol or use drugs.  REVIEW OF SYSTEMS:   Unable to obtain as pt is encephalopathic.  SUBJECTIVE:  Sedated after conscious sedation.  Comfortable.  VITAL SIGNS: BP (!) 107/49 (BP Location: Right Arm)   Pulse 99   Temp 97.3 F (36.3 C) (Oral)   Resp 19   Ht 5\' 11"  (1.803 m)   Wt 185 lb (83.9 kg)   SpO2 100%   BMI 25.80 kg/m   HEMODYNAMICS:    VENTILATOR SETTINGS:    INTAKE / OUTPUT: No  intake/output data recorded.   PHYSICAL EXAMINATION: General: Middle aged male, in NAD. Neuro: Sedated after conscious sedation. HEENT: [place]/AT. PERRL, sclerae anicteric. Cardiovascular: RRR, no M/R/G.  Lungs: Respirations shallow and unlabored.  Clear bilaterally. Abdomen: Abd distended.  BS x 4, soft, NT/ND.  Musculoskeletal: No gross deformities, no edema.  Skin: Intact, warm, no rashes.  LABS:  BMET  Recent Labs Lab 01/25/16 2134  NA 137  K 3.8  CL 112*  CO2 19*  BUN 15  CREATININE 0.89  GLUCOSE 274*    Electrolytes  Recent Labs Lab 01/25/16 2134  CALCIUM 7.7*    CBC  Recent Labs Lab  01/25/16 2134  WBC 10.9*  HGB 7.4*  HCT 23.1*  PLT 131*    Coag's  Recent Labs Lab 01/25/16 2135  APTT 29  INR 1.32    Sepsis Markers No results for input(s): LATICACIDVEN, PROCALCITON, O2SATVEN in the last 168 hours.  ABG No results for input(s): PHART, PCO2ART, PO2ART in the last 168 hours.  Liver Enzymes  Recent Labs Lab 01/25/16 2134  AST 37  ALT 32  ALKPHOS 111  BILITOT 1.2  ALBUMIN 2.5*    Cardiac Enzymes No results for input(s): TROPONINI, PROBNP in the last 168 hours.  Glucose No results for input(s): GLUCAP in the last 168 hours.  Imaging Dg Chest Port 1 View  Result Date: 01/25/2016 CLINICAL DATA:  Anterior chest pain and vomiting. EXAM: PORTABLE CHEST 1 VIEW COMPARISON:  Chest radiograph 05/28/2008 FINDINGS: There is a dual-lumen right chest wall Port-A-Cath with tip overlying the proximal right atrium. There is shallow lung inflation without focal consolidation or pulmonary edema. No pneumothorax or pleural effusion. Cardiomediastinal contours are normal. IMPRESSION: Clear lungs. Electronically Signed   By: Ulyses Jarred M.D.   On: 01/25/2016 22:45     STUDIES:  CXR 10/26 > no acute process.  CULTURES: None.  ANTIBIOTICS: None.  SIGNIFICANT EVENTS: 10/26 > admitted with UGIB > had emergent EGD with banding x 3 of esophageal varices.  LINES/TUBES: CVL pending 10/27 >  DISCUSSION: 55 y.o. male with hx stage IV colon CA with mets to liver and lung (currently undergoing chemo under care of Dr. Nelda Marseille at [facility]), admitted to [facility] 10/26 with UGIB.  Had emergent EGD which demonstrated mid - distal grade III esophageal varices to which 3 bands were placed.  He was started on protonix and octreotide and admitted to ICU for ongoing care.  ASSESSMENT / PLAN:  GASTROINTESTINAL A:   UGIB due to esophageal varices - s/p banding x 3 01/26/16 (Dr. Amedeo Plenty). Stage IV colon CA with mets to liver and lung - currently undergoing chemo under care of Dr. Nelda Marseille  at [facility]. Nutrition. P:   Post procedural care per GI - may require repeat EGD's. Continue protonix gtt, octreotide gtt. NPO.  HEMATOLOGIC / ONCOLOGIC A:   Acute blood loss anemia - due to UGIB. Thrombocytopenia. Stage IV colon CA with mets to liver and lung - currently undergoing chemo under care of Dr. Nelda Marseille at [facility]. P:  Transfuse for Hgb < 7. Monitor platelet counts. SCD's. H/H q6hrs x 4. Day team to please consult oncology and notify Dr. Nelda Marseille at [facility] of admission.  PULMONARY A: Acute hypoxic respiratory failure. Stage IV colon CA with mets to liver and lung - currently undergoing chemo under care of Dr. Nelda Marseille at [facility]. P:   Continue supplemental O2 as needed to maintain SpO2 > 92%. Day team to please consult oncology and notify Dr. Nelda Marseille at [facility] of  admission. Pulmonary hygiene.  CARDIOVASCULAR A:  Labile BP's - likely due to acute UGIB. Hx HTN, HLD, MI. P:  Place CVL. Continue IVF and PRBC resuscitation. Pressors if needed to maintain MAP > 65. Trend troponin. Assess lactate. Hold preadmission amlodipine, ASA, atenolol, atorvastatin, nitro, ramipril.  RENAL A:   Pseudohypocalcemia - corrects to 8.9. P:   Assess ionized calcium. NS @ 100. BMP in AM.  INFECTIOUS A:   No indication of infection. P:   Monitor clinically.  ENDOCRINE A:   Hyperglycemia - no hx DM. P:   SSI.  NEUROLOGIC A:   Hx anxiety. P:   Hold preadmission alprazolam, oxycodone, pregabalin, percocet, zolpidem.  Family updated: Pt's partner updated at bedside.  Interdisciplinary Family Meeting v Palliative Care Meeting:  Due by: 01/02/16.  CC time: 40 minutes.   Montey Hora, [place] [facility] Pager: [phone]  or [phone] 01/26/2016, 12:47 AM  Attending Note:  I have examined patient, reviewed labs, studies and notes. I have discussed the case with [place], and I agree with the data and plans as amended above. 55 yo man, hx  Stage IV colon CA on avastin/xeloda, liver and lung mets. Developed hematemesis 10/26. Taken for urgent EGD and had incomplete banding of esophageal varices. Hgb to 5.9 am 10/27. On eval this am he is weak but awake and interacts. Hemodynamically stable. Clear lung exam, no edema. He is on octreotide gttand has PRBC infusing. We will continue his supportive care, continue octreotide. Follow Hgb and give PRBC as indicated for goal Hgb > 8.0. GI has tentative plans to take him back for repeat EGD this am for more effective banding. Patient's mother and partner updated at bedside. Independent critical care time is 40 minutes.   Baltazar Apo, MD, PhD 01/26/2016, 6:39 AM [place] [facility] [phone] or if no answer [phone]

## 2016-01-27 ENCOUNTER — Inpatient Hospital Stay (HOSPITAL_COMMUNITY): Payer: PRIVATE HEALTH INSURANCE

## 2016-01-27 DIAGNOSIS — K920 Hematemesis: Secondary | ICD-10-CM

## 2016-01-27 LAB — BASIC METABOLIC PANEL
ANION GAP: 4 — AB (ref 5–15)
BUN: 16 mg/dL (ref 6–20)
CALCIUM: 7.3 mg/dL — AB (ref 8.9–10.3)
CO2: 20 mmol/L — AB (ref 22–32)
Chloride: 113 mmol/L — ABNORMAL HIGH (ref 101–111)
Creatinine, Ser: 0.71 mg/dL (ref 0.61–1.24)
GFR calc Af Amer: >60 mL/min (ref >60)
GFR calc non Af Amer: >60 mL/min (ref >60)
GLUCOSE: 154 mg/dL — AB (ref 65–99)
POTASSIUM: 3.8 mmol/L (ref 3.5–5.1)
Sodium: 137 mmol/L (ref 135–145)

## 2016-01-27 LAB — GLUCOSE, CAPILLARY
GLUCOSE-CAPILLARY: 259 mg/dL — AB (ref 65–99)
GLUCOSE-CAPILLARY: 96 mg/dL (ref 65–99)
GLUCOSE-CAPILLARY: 185 mg/dL — AB (ref 65–99)
Glucose-Capillary: 141 mg/dL — ABNORMAL HIGH (ref 65–99)
Glucose-Capillary: 142 mg/dL — ABNORMAL HIGH (ref 65–99)
Glucose-Capillary: 142 mg/dL — ABNORMAL HIGH (ref 65–99)

## 2016-01-27 LAB — CBC
HEMATOCRIT: 23.2 % — AB (ref 39.0–52.0)
HEMOGLOBIN: 7.6 g/dL — AB (ref 13.0–17.0)
MCH: 27.3 pg (ref 26.0–34.0)
MCHC: 32.8 g/dL (ref 30.0–36.0)
MCV: 83.5 fL (ref 78.0–100.0)
Platelets: 65 10*3/uL — ABNORMAL LOW (ref 150–400)
RBC: 2.78 MIL/uL — ABNORMAL LOW (ref 4.22–5.81)
RDW: 17.7 % — ABNORMAL HIGH (ref 11.5–15.5)
WBC: 5.3 10*3/uL (ref 4.0–10.5)

## 2016-01-27 LAB — CALCIUM, IONIZED: CALCIUM, IONIZED, SERUM: 4.3 mg/dL — AB (ref 4.5–5.6)

## 2016-01-27 MED ORDER — DEXTROSE 5 % IV SOLN
2.0000 g | INTRAVENOUS | Status: DC
  Administered 2016-01-27 – 2016-01-28 (×2): 2 g via INTRAVENOUS
  Filled 2016-01-27 (×2): qty 2

## 2016-01-27 NOTE — Progress Notes (Signed)
[place] Progress Note Patient Name: James Russell DOB: [birth year] MRN: [id number]   Date of Service  01/27/2016  HPI/Events of Note  Intermittent groin pain only present with standing.  Patient back in bed and pain gone.  eICU Interventions  No further eval at this time.   Examine at bedside on rounds in am.  Nurse to notify if pain worsens     Intervention Category Intermediate Interventions: Pain - evaluation and management  Mauri Brooklyn, P 01/27/2016, 10:43 PM

## 2016-01-27 NOTE — Progress Notes (Signed)
Patient c/o left groin pain 10/10 when standing and attempting to ambulate to the bathroom. He gait became unsteady and patient was placed back into bed. Pt.said pain began around 2 pm this afternoon when he stood to ambulate to the bathroom. He describes the pain as intermittent only with standing. He denies any pain with sitting or lying down. BP taken and PRN pain medication given. Called and notified [place] physician.

## 2016-01-27 NOTE — H&P (Signed)
PULMONARY / CRITICAL CARE MEDICINE   Name: James Russell MRN: [id number] DOB: [birth year]    ADMISSION DATE:  01/25/2016 CONSULTATION DATE:  01/25/16  REFERRING MD:  Lita Mains - EDP  CHIEF COMPLAINT:  Hematemesis  HISTORY OF PRESENT ILLNESS:  Pt is encephelopathic; therefore, this HPI is obtained from chart review. James Russell is a 55 y.o. male with PMH as outlined below including recently diagnosed Stage IV colon CA with mets to liver and lung, currently undergoing chemotherapy (Avastin/Xeloda under the care of Dr. Nelda Marseille at [facility].  Has also been on FOLFORI in the past).  He presented to [facility] ED 10/26 feeling weak and nauseated.  Symptoms began after eating supper earlier that evening.  He then had 2 episodes of hematemesis while at home.  He was brought to ED where he his Hgb was noted to be 7.  He was seen by Dr. Amedeo Plenty of GI and had emergent EGD performed which demonstrated 4 columns of recently bleeding grade III mid - distal esophageal varices along with clotted blood in the stomach..  He had 3 bands placed with incomplete eradication of the varices and slow ooze remained after the procedure.  He was started on PPI and octreotide infusions and PCCM was asked to admit to the ICU for ongoing care.  Of note, he had right hemicolectomy on 06/08/14 by Dr. Crisoforo Oxford at [facility].    PAST MEDICAL HISTORY :  He  has a past medical history of Cancer ([place]); H. pylori infection; Hypercholesteremia; Hypertension; Iron deficiency; and MI (myocardial infarction).  PAST SURGICAL HISTORY: He  has a past surgical history that includes Coronary artery bypass graft; implanted port; and Esophagogastroduodenoscopy (N/A, 01/25/2016).  Allergies  Allergen Reactions  . Tylenol [Acetaminophen]     Chills    No current facility-administered medications on file prior to encounter.    Current Outpatient Prescriptions on File Prior to Encounter  Medication Sig  . amLODipine (NORVASC) 10 MG tablet Take 10 mg by  mouth daily.  Marland Kitchen atenolol (TENORMIN) 100 MG tablet Take 50 mg by mouth daily.  . ciprofloxacin (CIPRO) 500 MG tablet Take 1 tablet (500 mg total) by mouth 2 (two) times daily. One po bid x 7 days (Patient not taking: Reported on 01/25/2016)  . docusate sodium (COLACE) 100 MG capsule Take 1 capsule (100 mg total) by mouth every 12 (twelve) hours. (Patient not taking: Reported on 01/25/2016)  . metroNIDAZOLE (FLAGYL) 500 MG tablet Take 1 tablet (500 mg total) by mouth 2 (two) times daily. One po bid x 7 days (Patient not taking: Reported on 01/25/2016)  . oxyCODONE-acetaminophen (PERCOCET) 5-325 MG per tablet Take 1 tablet by mouth every 6 (six) hours as needed for moderate pain. (Patient not taking: Reported on 01/25/2016)    FAMILY HISTORY:  His has no family status information on file.    SOCIAL HISTORY: He  reports that he has been smoking.  He has never used smokeless tobacco. He reports that he does not drink alcohol or use drugs.  REVIEW OF SYSTEMS:   Feels well  Denies any more hematemesis, melena Denies dyspnea, cough, wheezing Denies any chest pain, palpitations Hungry  SUBJECTIVE:    VITAL SIGNS: BP (!) 176/93   Pulse 97   Temp 98.6 F (37 C) (Oral)   Resp 16   Ht 5\' 11"  (1.803 m)   Wt 205 lb 4 oz (93.1 kg)   SpO2 99%   BMI 28.63 kg/m   HEMODYNAMICS:    VENTILATOR SETTINGS:  INTAKE / OUTPUT: I/O last 3 completed shifts: In: 3939.3 [I.V.:2800; Blood:1089.3; IV Piggyback:50] Out: 825 [Urine:825]   PHYSICAL EXAMINATION: General: Middle aged male, in no distress Neuro: Moves all 4 extremities. No focal deficits HEENT: Moist mucus membranes. Cardiovascular: RRR, no M/R/G.  Lungs: Respirations shallow and unlabored.  No wheeze or crackles Abdomen: Abd distended.  BS x 4, soft, NT/ND.  Musculoskeletal: No gross deformities, no edema.  Skin: Intact, warm, no rashes.  LABS:  BMET  Recent Labs Lab 01/25/16 2134 01/26/16 0335 01/26/16 1445  NA 137 136  136  K 3.8 5.2* 4.3  CL 112* 114* 114*  CO2 19* 19* 20*  BUN 15 15 17   CREATININE 0.89 0.76 0.77  GLUCOSE 274* 180* 158*    Electrolytes  Recent Labs Lab 01/25/16 2134 01/26/16 0335 01/26/16 1445  CALCIUM 7.7* 7.2* 7.0*  MG  --  1.7  --   PHOS  --  2.7  --     CBC  Recent Labs Lab 01/25/16 2134 01/26/16 0335 01/26/16 1445 01/26/16 1840  WBC 10.9*  --   --   --   HGB 7.4* 5.9* 7.8* 8.1*  HCT 23.1* 18.7* 23.2* 24.5*  PLT 131*  --   --   --     Coag's  Recent Labs Lab 01/25/16 2135  APTT 29  INR 1.32    Sepsis Markers  Recent Labs Lab 01/26/16 0353  LATICACIDVEN 1.7    ABG No results for input(s): PHART, PCO2ART, PO2ART in the last 168 hours.  Liver Enzymes  Recent Labs Lab 01/25/16 2134  AST 37  ALT 32  ALKPHOS 111  BILITOT 1.2  ALBUMIN 2.5*    Cardiac Enzymes  Recent Labs Lab 01/26/16 0335 01/26/16 1445  TROPONINI <0.03 <0.03    Glucose  Recent Labs Lab 01/26/16 0753 01/26/16 1156 01/26/16 1635 01/26/16 1934 01/26/16 2357 01/27/16 0343  GLUCAP 141* 166* 159* 164* 188* 141*    Imaging No results found.  STUDIES:  CXR 10/26 > no acute process.  CULTURES: None.  ANTIBIOTICS: Ceftriaxone 10/27 >  SIGNIFICANT EVENTS: 10/26 > admitted with UGIB > had emergent EGD with banding x 3 of esophageal varices.  LINES/TUBES: CVL pending 10/27 >  DISCUSSION: 55 y.o. male with hx stage IV colon CA with mets to liver and lung (currently undergoing chemo under care of Dr. Nelda Marseille at [facility]), admitted to [facility] 10/26 with UGIB.  Had emergent EGD which demonstrated mid - distal grade III esophageal varices to which 3 bands were placed.  He was started on protonix and octreotide and admitted to ICU for ongoing care.  ASSESSMENT / PLAN:  GASTROINTESTINAL A:   UGIB due to esophageal varices - s/p banding x 3 01/26/16 (Dr. Amedeo Plenty). Stage IV colon CA with mets to liver and lung - currently undergoing chemo under care of Dr. Nelda Marseille at  [facility]. Nutrition. P:   Continue octreotide gtt. Protonix changed to IV bid Start clears advance as tolerated On prophylactic ceftriaxone  HEMATOLOGIC / ONCOLOGIC A:   Acute blood loss anemia - due to UGIB. Thrombocytopenia. Stage IV colon CA with mets to liver and lung - currently undergoing chemo under care of Dr. Nelda Marseille at [facility]. P:  Transfuse for Hgb < 7. Monitor platelet counts. SCD's. H/H q12 [facility] notified, hold avastin, will need f/u with oncology post discharge  PULMONARY A: Acute hypoxic respiratory failure. Stage IV colon CA with mets to liver and lung - currently undergoing chemo under care of Dr. Nelda Marseille  at [facility]. P:   Continue supplemental O2 as needed to maintain SpO2 > 92%. Pulmonary hygiene.  CARDIOVASCULAR A:  Labile BP's - likely due to acute UGIB. Hx HTN, HLD, MI. P:  Holding preadmission amlodipine, ASA, atenolol, atorvastatin, nitro, ramipril.  RENAL A:   Pseudohypocalcemia - corrects to 8.9. P:   Monitor urine output and Cr  INFECTIOUS A:   No evidence of infection P:   On prophylactic ceftriaxone  ENDOCRINE A:   Hyperglycemia - no hx DM. P:   SSI.  NEUROLOGIC A:   Hx anxiety. P:   Hold preadmission alprazolam, oxycodone, pregabalin, percocet, zolpidem.  Family updated: Pt and partner updated at bedside. Interdisciplinary Family Meeting v Palliative Care Meeting:  Due by: 01/02/16.  Transfer to triad and SDU.  Marshell Garfinkel MD [facility] Pager [phone] If no answer or after 3pm call: [phone] 01/27/2016, 8:43 AM

## 2016-01-27 NOTE — Progress Notes (Signed)
Subjective: No hematemesis in 24 hours. Right upper quadrant soreness (mild, ongoing). Tolerating clear liquids. No bowel movement couple days.  Objective: Vital signs in last 24 hours: Temp:  [96.2 F (35.7 C)-98.6 F (37 C)] 96.2 F (35.7 C) (10/28 0800) Pulse Rate:  [87-129] 106 (10/28 1210) Resp:  [13-26] 17 (10/28 1210) BP: (73-190)/(42-93) 154/81 (10/28 1207) SpO2:  [95 %-100 %] 99 % (10/28 1210) Weight:  [93.1 kg (205 lb 4 oz)] 93.1 kg (205 lb 4 oz) (10/28 0500) Weight change: 9.184 kg (20 lb 4 oz) Last BM Date: 01/25/16  PE: GEN:  NAD, alert, no encephalopathy HEENT:  Pale conjunctiva; no scleral icterus ABD:  Soft, protuberant with dullness; mild generalized tenderness  Lab Results: CBC    Component Value Date/Time   WBC 5.3 01/27/2016 0859   RBC 2.78 (L) 01/27/2016 0859   HGB 7.6 (L) 01/27/2016 0859   HCT 23.2 (L) 01/27/2016 0859   PLT 65 (L) 01/27/2016 0859   MCV 83.5 01/27/2016 0859   MCH 27.3 01/27/2016 0859   MCHC 32.8 01/27/2016 0859   RDW 17.7 (H) 01/27/2016 0859   LYMPHSABS 1.4 05/28/2008 1825   MONOABS 0.5 05/28/2008 1825   EOSABS 0.1 05/28/2008 1825   BASOSABS 0.0 05/28/2008 1825   CMP     Component Value Date/Time   NA 137 01/27/2016 0859   K 3.8 01/27/2016 0859   CL 113 (H) 01/27/2016 0859   CO2 20 (L) 01/27/2016 0859   GLUCOSE 154 (H) 01/27/2016 0859   BUN 16 01/27/2016 0859   CREATININE 0.71 01/27/2016 0859   CALCIUM 7.3 (L) 01/27/2016 0859   PROT 4.8 (L) 01/25/2016 2134   ALBUMIN 2.5 (L) 01/25/2016 2134   AST 37 01/25/2016 2134   ALT 32 01/25/2016 2134   ALKPHOS 111 01/25/2016 2134   BILITOT 1.2 01/25/2016 2134   GFRNONAA >60 01/27/2016 0859   GFRAA >60 01/27/2016 0859   INR 1.32  Assessment:  1.  Acute variceal bleeding, with variceal band ligation.  On octreotide.  No bleeding in 24 hours.  Unclear if cirrhosis or just portal hypertension from his liver metastases. 2.  Acute blood loss anemia. 3.  Metastatic colon cancer  with liver metastases. 4.  Abdominal distention.  Constipation versus ascites.  Plan:  1.  Continue octreotide gtt and IV PPI for another 24 hours. 2.  Advance diet to full liquids. 3.  Abdominal ultrasound to assess for ascites; if ascites seen, patient will need to be placed on prophylactic antibiotics. 4.  Stool softener for possible constipation (Miralax 17 grams po bid for now). 5.  Follow CBC. 6.  Ultimately, will need repeat EGD in 3-4 weeks as outpatient for further esophageal variceal band ligation. 7.  [facility] will follow.   James Russell 01/27/2016, 1:15 PM   Pager [phone] If no answer or after 5 PM call [phone]

## 2016-01-28 LAB — CBC
HEMATOCRIT: 22.4 % — AB (ref 39.0–52.0)
Hemoglobin: 7.2 g/dL — ABNORMAL LOW (ref 13.0–17.0)
MCH: 27 pg (ref 26.0–34.0)
MCHC: 32.1 g/dL (ref 30.0–36.0)
MCV: 83.9 fL (ref 78.0–100.0)
Platelets: 72 10*3/uL — ABNORMAL LOW (ref 150–400)
RBC: 2.67 MIL/uL — ABNORMAL LOW (ref 4.22–5.81)
RDW: 18 % — AB (ref 11.5–15.5)
WBC: 4.9 10*3/uL (ref 4.0–10.5)

## 2016-01-28 LAB — GLUCOSE, CAPILLARY
GLUCOSE-CAPILLARY: 180 mg/dL — AB (ref 65–99)
GLUCOSE-CAPILLARY: 132 mg/dL — AB (ref 65–99)
GLUCOSE-CAPILLARY: 191 mg/dL — AB (ref 65–99)
Glucose-Capillary: 185 mg/dL — ABNORMAL HIGH (ref 65–99)
Glucose-Capillary: 142 mg/dL — ABNORMAL HIGH (ref 65–99)
Glucose-Capillary: 137 mg/dL — ABNORMAL HIGH (ref 65–99)

## 2016-01-28 LAB — BASIC METABOLIC PANEL
Anion gap: 3 — ABNORMAL LOW (ref 5–15)
BUN: 13 mg/dL (ref 6–20)
CALCIUM: 7.3 mg/dL — AB (ref 8.9–10.3)
CO2: 21 mmol/L — AB (ref 22–32)
CREATININE: 0.78 mg/dL (ref 0.61–1.24)
Chloride: 113 mmol/L — ABNORMAL HIGH (ref 101–111)
GFR calc Af Amer: >60 mL/min (ref >60)
GFR calc non Af Amer: >60 mL/min (ref >60)
GLUCOSE: 136 mg/dL — AB (ref 65–99)
Potassium: 3.4 mmol/L — ABNORMAL LOW (ref 3.5–5.1)
Sodium: 137 mmol/L (ref 135–145)

## 2016-01-28 LAB — PHOSPHORUS: Phosphorus: 2.1 mg/dL — ABNORMAL LOW (ref 2.5–4.6)

## 2016-01-28 LAB — PREPARE RBC (CROSSMATCH)

## 2016-01-28 LAB — MAGNESIUM: Magnesium: 1.9 mg/dL (ref 1.7–2.4)

## 2016-01-28 MED ORDER — POLYETHYLENE GLYCOL 3350 17 G PO PACK
17.0000 g | PACK | Freq: Two times a day (BID) | ORAL | Status: DC
  Administered 2016-01-28 – 2016-02-02 (×10): 17 g via ORAL
  Filled 2016-01-28 (×11): qty 1

## 2016-01-28 MED ORDER — ZOLPIDEM TARTRATE 10 MG PO TABS: 10.0000 mg | ORAL_TABLET | Freq: Every evening | ORAL | Status: DC | PRN

## 2016-01-28 MED ORDER — POTASSIUM CHLORIDE CRYS ER 20 MEQ PO TBCR
40.0000 meq | EXTENDED_RELEASE_TABLET | Freq: Once | ORAL | Status: AC
  Administered 2016-01-28: 40 meq via ORAL
  Filled 2016-01-28: qty 2

## 2016-01-28 MED ORDER — RAMIPRIL 10 MG PO CAPS
10.0000 mg | ORAL_CAPSULE | Freq: Two times a day (BID) | ORAL | Status: DC
  Administered 2016-01-28 – 2016-02-02 (×11): 10 mg via ORAL
  Filled 2016-01-28 (×11): qty 1

## 2016-01-28 MED ORDER — CIPROFLOXACIN HCL 500 MG PO TABS
500.0000 mg | ORAL_TABLET | Freq: Two times a day (BID) | ORAL | Status: DC
  Administered 2016-01-28 – 2016-02-02 (×10): 500 mg via ORAL
  Filled 2016-01-28 (×10): qty 1

## 2016-01-28 MED ORDER — OXYCODONE HCL 5 MG PO TABS
10.0000 mg | ORAL_TABLET | ORAL | Status: DC | PRN
  Administered 2016-01-28 – 2016-02-01 (×2): 10 mg via ORAL
  Filled 2016-01-28 (×2): qty 2

## 2016-01-28 MED ORDER — HYDROMORPHONE HCL 1 MG/ML IJ SOLN
1.0000 mg | INTRAMUSCULAR | Status: DC | PRN
  Administered 2016-01-28 – 2016-02-02 (×28): 1 mg via INTRAVENOUS
  Filled 2016-01-28 (×28): qty 1

## 2016-01-28 MED ORDER — ALPRAZOLAM 0.5 MG PO TABS: 0.5000 mg | ORAL_TABLET | Freq: Every evening | ORAL | Status: DC | PRN

## 2016-01-28 MED ORDER — AMLODIPINE BESYLATE 10 MG PO TABS
10.0000 mg | ORAL_TABLET | Freq: Every day | ORAL | Status: DC
  Administered 2016-01-28 – 2016-02-02 (×6): 10 mg via ORAL
  Filled 2016-01-28 (×6): qty 1

## 2016-01-28 MED ORDER — POTASSIUM & SODIUM PHOSPHATES 280-160-250 MG PO PACK
1.0000 | PACK | Freq: Three times a day (TID) | ORAL | Status: AC
  Administered 2016-01-28 (×3): 1 via ORAL
  Filled 2016-01-28 (×3): qty 1

## 2016-01-28 MED ORDER — FENTANYL CITRATE (PF) 100 MCG/2ML IJ SOLN
25.0000 ug | INTRAMUSCULAR | Status: DC | PRN
  Administered 2016-01-28 (×6): 100 ug via INTRAVENOUS
  Filled 2016-01-28 (×6): qty 2

## 2016-01-28 MED ORDER — SODIUM CHLORIDE 0.9 % IV SOLN
Freq: Once | INTRAVENOUS | Status: AC
  Administered 2016-01-28: 15:00:00 via INTRAVENOUS

## 2016-01-28 MED ORDER — PREGABALIN 75 MG PO CAPS
75.0000 mg | ORAL_CAPSULE | Freq: Two times a day (BID) | ORAL | Status: DC
  Administered 2016-01-28 – 2016-01-31 (×7): 75 mg via ORAL
  Filled 2016-01-28 (×7): qty 1

## 2016-01-28 MED ORDER — POTASSIUM PHOSPHATES 15 MMOLE/5ML IV SOLN: 30.0000 mmol | Freq: Once | INTRAVENOUS | Status: DC

## 2016-01-28 MED ORDER — PANTOPRAZOLE SODIUM 40 MG PO TBEC
40.0000 mg | DELAYED_RELEASE_TABLET | Freq: Every day | ORAL | Status: DC
  Administered 2016-01-29 – 2016-02-02 (×5): 40 mg via ORAL
  Filled 2016-01-28 (×5): qty 1

## 2016-01-28 MED ORDER — POLYVINYL ALCOHOL 1.4 % OP SOLN
1.0000 [drp] | OPHTHALMIC | Status: DC | PRN
  Administered 2016-01-28: 1 [drp] via OPHTHALMIC
  Filled 2016-01-28 (×2): qty 15

## 2016-01-28 NOTE — Progress Notes (Signed)
Called [facility] and notified physician of patient's c/o left groin pain 10/10 and burning now at rest. No skin lesions or noticable bruising to the site. No noticeable bulging seen. Patient also does has c/o increased groin pain with coughing.

## 2016-01-28 NOTE — Progress Notes (Signed)
PROGRESS NOTE    James Russell  [id number] DOB: [birth year] DOA: 01/25/2016 PCP: Pcp Not In System    Brief Narrative: James Russell is a 55 y.o. male with PMH as outlined below including recently diagnosed Stage IV colon CA with mets to liver and lung, currently undergoing chemotherapy (Avastin/Xeloda under the care of Dr. Nelda Marseille at [facility], presents with nausea, and generalized weakness, and 2 episodes of hematemesis.  He was seen by Dr. Amedeo Plenty of GI and had emergent EGD performed which demonstrated 4 columns of recently bleeding grade III mid - distal esophageal varices along with clotted blood in the stomach..  He had 3 bands placed with incomplete eradication of the varices and slow ooze remained after the procedure. He was initially admitted BY PCCM service and transferred to [facility] service on 10/29.   Assessment & Plan:   Active Problems:   GI bleed   GI bleed from esophageal varices, s/p banding on 10/27,  Monitor hemoglobin, one unit of prbc transfusion ordered.  Started on clears and advance as tolerated.  Prophylactic antibiotic, on rocephin, change to oral ciprofloxacin.  Monitor hemoglobin in am.   Anxiety: resume xanax, pregabalin, zolpidem  Anemia of blood loss: - monitor and transfuse to keep it greater than 7.    Metastatic colon cancer with liver and lung mets: S/p chemo, follows with Dr Nelda Marseille at [facility].       DVT prophylaxis: (Lovenox/Heparin/SCD's/anticoagulated/None (if comfort care) Code Status: (Full/) Family Communication: wife at bedside.  Disposition Plan:  Pending further eval. .   Consultants:   GASTROENTEROLOGY.    Procedures: EGD on 10/27   Antimicrobials: none.    Subjective: Reports he had left groin pain yesterday when he got up. Now its resolved.  Objective: Vitals:   01/28/16 1400 01/28/16 1416 01/28/16 1434 01/28/16 1500  BP: (!) 155/78 (!) 160/77 (!) 153/75 (!) 167/92  Pulse: 94 88 85 92  Resp: 16 (!) 23 18 17   Temp:  98 F  (36.7 C) 98.2 F (36.8 C)   TempSrc:  Oral Oral   SpO2: 99% 99% 97% 96%  Weight:      Height:        Intake/Output Summary (Last 24 hours) at 01/28/16 1546 Last data filed at 01/28/16 1434  Gross per 24 hour  Intake             2415 ml  Output              650 ml  Net             1765 ml   Filed Weights   01/26/16 0213 01/27/16 0500 01/28/16 0500  Weight: 87.3 kg (192 lb 7.4 oz) 93.1 kg (205 lb 4 oz) 94.4 kg (208 lb 1.8 oz)    Examination:  General exam: Appears calm and comfortable  Respiratory system: Clear to auscultation. Respiratory effort normal. Cardiovascular system: S1 & S2 heard, RRR. No JVD, murmurs, rubs, gallops or clicks. No pedal edema. Gastrointestinal system: Abdomen is nondistended, soft and nontender. No organomegaly or masses felt. Normal bowel sounds heard. Central nervous system: Alert and oriented. No focal neurological deficits. Extremities: Symmetric 5 x 5 power. Skin: No rashes, lesions or ulcers Psychiatry: Judgement and insight appear normal. Mood & affect appropriate.     Data Reviewed: I have personally reviewed following labs and imaging studies  CBC:  Recent Labs Lab 01/25/16 2134 01/26/16 0335 01/26/16 1445 01/26/16 1840 01/27/16 0859 01/28/16 0500  WBC 10.9*  --   --   --  5.3 4.9  HGB 7.4* 5.9* 7.8* 8.1* 7.6* 7.2*  HCT 23.1* 18.7* 23.2* 24.5* 23.2* 22.4*  MCV 82.2  --   --   --  83.5 83.9  PLT 131*  --   --   --  65* 72*   Basic Metabolic Panel:  Recent Labs Lab 01/25/16 2134 01/26/16 0335 01/26/16 1445 01/27/16 0859 01/28/16 0500  NA 137 136 136 137 137  K 3.8 5.2* 4.3 3.8 3.4*  CL 112* 114* 114* 113* 113*  CO2 19* 19* 20* 20* 21*  GLUCOSE 274* 180* 158* 154* 136*  BUN 15 15 17 16 13   CREATININE 0.89 0.76 0.77 0.71 0.78  CALCIUM 7.7* 7.2* 7.0* 7.3* 7.3*  MG  --  1.7  --   --  1.9  PHOS  --  2.7  --   --  2.1*   GFR: Estimated Creatinine Clearance: 119.5 mL/min (by C-G formula based on SCr of 0.78  mg/dL). Liver Function Tests:  Recent Labs Lab 01/25/16 2134  AST 37  ALT 32  ALKPHOS 111  BILITOT 1.2  PROT 4.8*  ALBUMIN 2.5*    Recent Labs Lab 01/25/16 2134  LIPASE 77*   No results for input(s): AMMONIA in the last 168 hours. Coagulation Profile:  Recent Labs Lab 01/25/16 2135  INR 1.32   Cardiac Enzymes:  Recent Labs Lab 01/26/16 0335 01/26/16 1445  TROPONINI <0.03 <0.03   BNP (last 3 results) No results for input(s): PROBNP in the last 8760 hours. HbA1C: No results for input(s): HGBA1C in the last 72 hours. CBG:  Recent Labs Lab 01/27/16 2325 01/28/16 0107 01/28/16 0341 01/28/16 0735 01/28/16 1220  GLUCAP 185* 180* 185* 132* 142*   Lipid Profile: No results for input(s): CHOL, HDL, LDLCALC, TRIG, CHOLHDL, LDLDIRECT in the last 72 hours. Thyroid Function Tests: No results for input(s): TSH, T4TOTAL, FREET4, T3FREE, THYROIDAB in the last 72 hours. Anemia Panel: No results for input(s): VITAMINB12, FOLATE, FERRITIN, TIBC, IRON, RETICCTPCT in the last 72 hours. Sepsis Labs:  Recent Labs Lab 01/26/16 0353  LATICACIDVEN 1.7    Recent Results (from the past 240 hour(s))  MRSA PCR Screening     Status: None   Collection Time: 01/26/16  2:14 AM  Result Value Ref Range Status   MRSA by PCR NEGATIVE NEGATIVE Final    Comment:        The GeneXpert MRSA Assay (FDA approved for NASAL specimens only), is one component of a comprehensive MRSA colonization surveillance program. It is not intended to diagnose MRSA infection nor to guide or monitor treatment for MRSA infections.          Radiology Studies: Dg Chest Port 1 View  Result Date: 01/27/2016 CLINICAL DATA:  Respiratory failure. EXAM: PORTABLE CHEST 1 VIEW COMPARISON:  01/26/2016 FINDINGS: Right-sided Port-A-Cath in satisfactory position. Left IJ central venous catheter with the tip projecting over the SVC. Low lung volumes. No focal consolidation, pleural effusion or  pneumothorax. Stable cardiomediastinal silhouette. No acute osseous abnormality. IMPRESSION: No active disease. Electronically Signed   By: Kathreen Devoid   On: 01/27/2016 08:56   US Abdomen Limited Ruq  Result Date: 01/28/2016 CLINICAL DATA:  Assess for ascites. EXAM: US ABDOMEN LIMITED - RIGHT UPPER QUADRANT COMPARISON:  None. FINDINGS: Gallbladder: The gallbladder is mildly distended. The gallbladder wall measures 3.1 mm which is borderline. An 8 mm stone is identified with no Murphy's sign or pericholecystic fluid. Common bile duct: Diameter: 5.3 mm Liver: The liver is heterogeneous. By report,  the patient has liver metastases but no focal masses are seen on this study. There is an apparent mildly nodular contour to the liver. There is adjacent ascites. IMPRESSION: 1. The liver is heterogeneous with suggestion of a nodular contour. There is adjacent ascites. Despite the history of metastatic disease, no discrete masses were imaged by the sonographer. 2. The gallbladder is mildly distended and the gallbladder wall measures 3.1 mm which is borderline. A single 8 mm stone is seen with no Murphy's sign. Electronically Signed   By: Dorise Bullion III M.D   On: 01/28/2016 01:51        Scheduled Meds: . amLODipine  10 mg Oral Daily  . cefTRIAXone (ROCEPHIN)  IV  2 g Intravenous Q24H  . insulin aspart  0-15 Units Subcutaneous Q4H  . mouth rinse  15 mL Mouth Rinse BID  . [START ON 01/29/2016] pantoprazole  40 mg Oral Daily  . polyethylene glycol  17 g Oral BID  . potassium & sodium phosphates  1 packet Oral TID  . pregabalin  75 mg Oral BID  . ramipril  10 mg Oral BID   Continuous Infusions: . sodium chloride 75 mL/hr at 01/28/16 1427     LOS: 2 days    Time spent: 30 minutes.     Hosie Poisson, MD [facility] Pager [phone]  If 7PM-7AM, please contact night-coverage [email or web address] Password [facility] 01/28/2016, 3:46 PM

## 2016-01-28 NOTE — Progress Notes (Signed)
Subjective: One episode of dark stool; no hematemesis. No abdominal pain. Feels constipated.   Objective: Vital signs in last 24 hours: Temp:  [98 F (36.7 C)-98.7 F (37.1 C)] 98 F (36.7 C) (10/29 0800) Pulse Rate:  [86-129] 89 (10/29 1000) Resp:  [11-21] 12 (10/29 1000) BP: (113-160)/(59-82) 160/81 (10/29 1000) SpO2:  [96 %-100 %] 96 % (10/29 1000) Weight:  [94.4 kg (208 lb 1.8 oz)] 94.4 kg (208 lb 1.8 oz) (10/29 0500) Weight change: 1.3 kg (2 lb 13.9 oz) Last BM Date: 01/27/16  PE: GEN:  NAD ABD:  Protuberant, moderate distended (with tympany and dullness admixed), non-tender  Lab Results: CBC    Component Value Date/Time   WBC 4.9 01/28/2016 0500   RBC 2.67 (L) 01/28/2016 0500   HGB 7.2 (L) 01/28/2016 0500   HCT 22.4 (L) 01/28/2016 0500   PLT 72 (L) 01/28/2016 0500   MCV 83.9 01/28/2016 0500   MCH 27.0 01/28/2016 0500   MCHC 32.1 01/28/2016 0500   RDW 18.0 (H) 01/28/2016 0500   LYMPHSABS 1.4 05/28/2008 1825   MONOABS 0.5 05/28/2008 1825   EOSABS 0.1 05/28/2008 1825   BASOSABS 0.0 05/28/2008 1825   CMP     Component Value Date/Time   NA 137 01/28/2016 0500   K 3.4 (L) 01/28/2016 0500   CL 113 (H) 01/28/2016 0500   CO2 21 (L) 01/28/2016 0500   GLUCOSE 136 (H) 01/28/2016 0500   BUN 13 01/28/2016 0500   CREATININE 0.78 01/28/2016 0500   CALCIUM 7.3 (L) 01/28/2016 0500   PROT 4.8 (L) 01/25/2016 2134   ALBUMIN 2.5 (L) 01/25/2016 2134   AST 37 01/25/2016 2134   ALT 32 01/25/2016 2134   ALKPHOS 111 01/25/2016 2134   BILITOT 1.2 01/25/2016 2134   GFRNONAA >60 01/28/2016 0500   GFRAA >60 01/28/2016 0500   INR 1.32 Studies/Results: Dg Chest Port 1 View  Result Date: 01/27/2016 CLINICAL DATA:  Respiratory failure. EXAM: PORTABLE CHEST 1 VIEW COMPARISON:  01/26/2016 FINDINGS: Right-sided Port-A-Cath in satisfactory position. Left IJ central venous catheter with the tip projecting over the SVC. Low lung volumes. No focal consolidation, pleural effusion or  pneumothorax. Stable cardiomediastinal silhouette. No acute osseous abnormality. IMPRESSION: No active disease. Electronically Signed   By: Kathreen Devoid   On: 01/27/2016 08:56   US Abdomen Limited Ruq  Result Date: 01/28/2016 CLINICAL DATA:  Assess for ascites. EXAM: US ABDOMEN LIMITED - RIGHT UPPER QUADRANT COMPARISON:  None. FINDINGS: Gallbladder: The gallbladder is mildly distended. The gallbladder wall measures 3.1 mm which is borderline. An 8 mm stone is identified with no Murphy's sign or pericholecystic fluid. Common bile duct: Diameter: 5.3 mm Liver: The liver is heterogeneous. By report, the patient has liver metastases but no focal masses are seen on this study. There is an apparent mildly nodular contour to the liver. There is adjacent ascites. IMPRESSION: 1. The liver is heterogeneous with suggestion of a nodular contour. There is adjacent ascites. Despite the history of metastatic disease, no discrete masses were imaged by the sonographer. 2. The gallbladder is mildly distended and the gallbladder wall measures 3.1 mm which is borderline. A single 8 mm stone is seen with no Murphy's sign. Electronically Signed   By: Dorise Bullion III M.D   On: 01/28/2016 01:51   Assessment:  1.  Acute variceal bleeding, with variceal band ligation.  On octreotide.  No bleeding in 48 hours.  Unclear if cirrhosis or just portal hypertension from his liver metastases.  No obvious  liver lesions seen on yesterday's ultrasound, but liver does have a nodular contour (suggestive of underlying cirrhosis). 2.  Acute blood loss anemia.  Hgb slight downtrend from yesterday.  Melena yesterday likely reflective of old blood. 3.  Metastatic colon cancer with liver metastases. 4.  Abdominal distention.  Constipation versus ascites.  Suspect component of both (ascites seen on yesterdays' ultrasound).  Plan:  1.  Stop octreotide. 2.  Transition pantoprazole to 40 mg po qd. 3.  Advance diet to full liquids. 4.   Miralax 17 g po bid for constipation. 5.  Continue antibiotics (variceal bleed with ascites); ok to transition to oral formulation (ciprofloxacin is acceptable), to complete 10-day total course of antibiotics. 6.  OOBTC as tolerated. 7.  Consider blood transfusion if Hgb drops any further. 8.  OK to transition to floor bed from GI perspective. 9.  Hopefully be able to discharge home in 1-2 days from GI perspective. 10. Patient will need repeat outpatient endoscopy in 3-4 weeks for further variceal obliteration with Dr. Amedeo Plenty. 11. [facility] will follow.   [name] 01/28/2016, 10:16 AM   Pager [phone] If no answer or after 5 PM call [phone]

## 2016-01-28 NOTE — Progress Notes (Signed)
[place] Progress Note Patient Name: James Russell DOB: [birth year] MRN: [id number]   Date of Service  01/28/2016  HPI/Events of Note  Hypokalemia and hypophos  eICU Interventions  Potassium and phos replaced     Intervention Category Intermediate Interventions: Electrolyte abnormality - evaluation and management  Guila Owensby 01/28/2016, 6:05 AM

## 2016-01-28 NOTE — Progress Notes (Signed)
[place] Progress Note Patient Name: James Russell DOB: [birth year] MRN: [id number]   Date of Service  01/28/2016  HPI/Events of Note  Patient c/o of right groin pain 10/10.  Has received some relief with fentanyl 75 mcg.  No obvious bulge in area, change in skin, rash or open areas.  HD stable.  eICU Interventions  Plan: Increase PRN fentanyl dose to 100 mcg q2 hours Rounding MD to evaluate in AM     Intervention Category Intermediate Interventions: Pain - evaluation and management  DETERDING,ELIZABETH 01/28/2016, 1:19 AM

## 2016-01-29 ENCOUNTER — Inpatient Hospital Stay (HOSPITAL_COMMUNITY): Payer: PRIVATE HEALTH INSURANCE

## 2016-01-29 LAB — TYPE AND SCREEN
ABO/RH(D): A POS
Antibody Screen: NEGATIVE
UNIT DIVISION: 0
UNIT DIVISION: 0
UNIT DIVISION: 0

## 2016-01-29 LAB — BASIC METABOLIC PANEL
Anion gap: 3 — ABNORMAL LOW (ref 5–15)
BUN: 10 mg/dL (ref 6–20)
CHLORIDE: 113 mmol/L — AB (ref 101–111)
CO2: 21 mmol/L — ABNORMAL LOW (ref 22–32)
Calcium: 7.4 mg/dL — ABNORMAL LOW (ref 8.9–10.3)
Creatinine, Ser: 0.68 mg/dL (ref 0.61–1.24)
GFR calc Af Amer: >60 mL/min (ref >60)
GFR calc non Af Amer: >60 mL/min (ref >60)
GLUCOSE: 133 mg/dL — AB (ref 65–99)
POTASSIUM: 3.9 mmol/L (ref 3.5–5.1)
Sodium: 137 mmol/L (ref 135–145)

## 2016-01-29 LAB — CBC
HCT: 27.3 % — ABNORMAL LOW (ref 39.0–52.0)
HEMOGLOBIN: 8.9 g/dL — AB (ref 13.0–17.0)
MCH: 27.6 pg (ref 26.0–34.0)
MCHC: 32.6 g/dL (ref 30.0–36.0)
MCV: 84.8 fL (ref 78.0–100.0)
PLATELETS: 80 10*3/uL — AB (ref 150–400)
RBC: 3.22 MIL/uL — AB (ref 4.22–5.81)
RDW: 17.6 % — ABNORMAL HIGH (ref 11.5–15.5)
WBC: 6.4 10*3/uL (ref 4.0–10.5)

## 2016-01-29 LAB — GLUCOSE, CAPILLARY
GLUCOSE-CAPILLARY: 123 mg/dL — AB (ref 65–99)
GLUCOSE-CAPILLARY: 148 mg/dL — AB (ref 65–99)
Glucose-Capillary: 136 mg/dL — ABNORMAL HIGH (ref 65–99)
Glucose-Capillary: 164 mg/dL — ABNORMAL HIGH (ref 65–99)
Glucose-Capillary: 166 mg/dL — ABNORMAL HIGH (ref 65–99)
Glucose-Capillary: 207 mg/dL — ABNORMAL HIGH (ref 65–99)

## 2016-01-29 LAB — PHOSPHORUS: Phosphorus: 2.6 mg/dL (ref 2.5–4.6)

## 2016-01-29 MED ORDER — NAPHAZOLINE-GLYCERIN 0.012-0.2 % OP SOLN
1.0000 [drp] | Freq: Four times a day (QID) | OPHTHALMIC | Status: DC | PRN
  Filled 2016-01-29 (×2): qty 15

## 2016-01-29 MED ORDER — KETOTIFEN FUMARATE 0.025 % OP SOLN
1.0000 [drp] | Freq: Two times a day (BID) | OPHTHALMIC | Status: DC
  Administered 2016-01-29 – 2016-02-01 (×7): 1 [drp] via OPHTHALMIC
  Filled 2016-01-29: qty 5

## 2016-01-29 NOTE — Progress Notes (Signed)
PROGRESS NOTE    James Russell  [id number] DOB: 01-04-1961 DOA: 01/25/2016 PCP: Pcp Not In System    Brief Narrative: James Russell is a 55 y.o. male with PMH as outlined below including recently diagnosed Stage IV colon CA with mets to liver and lung, currently undergoing chemotherapy (Avastin/Xeloda under the care of Dr. Nelda Marseille at [facility], presents with nausea, and generalized weakness, and 2 episodes of hematemesis.  He was seen by Dr. Amedeo Plenty of GI and had emergent EGD performed which demonstrated 4 columns of recently bleeding grade III mid - distal esophageal varices along with clotted blood in the stomach..  He had 3 bands placed with incomplete eradication of the varices and slow ooze remained after the procedure. He was initially admitted BY PCCM service and transferred to [facility] service on 10/29.   Assessment & Plan:   Active Problems:   GI bleed   GI bleed from esophageal varices, s/p banding on 10/27, .  Monitor hemoglobin, one unit of prbc transfusion ordered. Repeat hemoglobin 8.9. Started on clears and advance as tolerated. Currently on soft diet.   Prophylactic antibiotic, on rocephin, change to oral ciprofloxacin.  Monitor hemoglobin in am.   Anxiety: resume xanax, pregabalin, zolpidem  Anemia of blood loss: - monitor and transfuse to keep it greater than 7.    Metastatic colon cancer with liver and lung mets: S/p chemo, follows with Dr Nelda Marseille at [facility].    Left inguinal pain: suspect inguinal hernia.  Surgery consulted for recommendations. And pain control.    Allergic conjunctivitis: ketotifen eye drops ordered.    DVT prophylaxis: (SCD'S Code Status: (Full/) Family Communication: son at bedside.  Disposition Plan:  Pending PT evaluation.   Consultants:   GASTROENTEROLOGY.    Procedures: EGD on 10/27   Antimicrobials: none.    Subjective: Worsening left groin pain.   Objective: Vitals:   01/29/16 1300 01/29/16 1400 01/29/16 1500 01/29/16 1626    BP: 124/62 135/65 (!) 109/58 129/71  Pulse: (!) 107 (!) 109 (!) 103 (!) 108  Resp: 15 17 13 20   Temp:    98.8 F (37.1 C)  TempSrc:    Oral  SpO2: 98% 97% 96% 99%  Weight:    98.9 kg (218 lb)  Height:    5\' 11"  (1.803 m)    Intake/Output Summary (Last 24 hours) at 01/29/16 1714 Last data filed at 01/29/16 1500  Gross per 24 hour  Intake             2029 ml  Output                0 ml  Net             2029 ml   Filed Weights   01/28/16 0500 01/29/16 0500 01/29/16 1626  Weight: 94.4 kg (208 lb 1.8 oz) 97 kg (213 lb 13.5 oz) 98.9 kg (218 lb)    Examination:  General exam: Appears calm and comfortable  Respiratory system: Clear to auscultation. Respiratory effort normal. Cardiovascular system: S1 & S2 heard, RRR. No JVD, murmurs, rubs, gallops or clicks. No pedal edema. Gastrointestinal system: Abdomen is nondistended, soft and nontender. No organomegaly or masses felt. Normal bowel sounds heard. Central nervous system: Alert and oriented. No focal neurological deficits. Extremities: Symmetric 5 x 5 power. Skin: No rashes, lesions or ulcers Psychiatry: Judgement and insight appear normal. Mood & affect appropriate.     Data Reviewed: I have personally reviewed following labs and imaging studies  CBC:  Recent Labs Lab 01/25/16 2134  01/26/16 1445 01/26/16 1840 01/27/16 0859 01/28/16 0500 01/29/16 0542  WBC 10.9*  --   --   --  5.3 4.9 6.4  HGB 7.4*  < > 7.8* 8.1* 7.6* 7.2* 8.9*  HCT 23.1*  < > 23.2* 24.5* 23.2* 22.4* 27.3*  MCV 82.2  --   --   --  83.5 83.9 84.8  PLT 131*  --   --   --  65* 72* 80*  < > = values in this interval not displayed. Basic Metabolic Panel:  Recent Labs Lab 01/26/16 0335 01/26/16 1445 01/27/16 0859 01/28/16 0500 01/29/16 0542  NA 136 136 137 137 137  K 5.2* 4.3 3.8 3.4* 3.9  CL 114* 114* 113* 113* 113*  CO2 19* 20* 20* 21* 21*  GLUCOSE 180* 158* 154* 136* 133*  BUN 15 17 16 13 10   CREATININE 0.76 0.77 0.71 0.78 0.68  CALCIUM  7.2* 7.0* 7.3* 7.3* 7.4*  MG 1.7  --   --  1.9  --   PHOS 2.7  --   --  2.1* 2.6   GFR: Estimated Creatinine Clearance: 122.1 mL/min (by C-G formula based on SCr of 0.68 mg/dL). Liver Function Tests:  Recent Labs Lab 01/25/16 2134  AST 37  ALT 32  ALKPHOS 111  BILITOT 1.2  PROT 4.8*  ALBUMIN 2.5*    Recent Labs Lab 01/25/16 2134  LIPASE 77*   No results for input(s): AMMONIA in the last 168 hours. Coagulation Profile:  Recent Labs Lab 01/25/16 2135  INR 1.32   Cardiac Enzymes:  Recent Labs Lab 01/26/16 0335 01/26/16 1445  TROPONINI <0.03 <0.03   BNP (last 3 results) No results for input(s): PROBNP in the last 8760 hours. HbA1C: No results for input(s): HGBA1C in the last 72 hours. CBG:  Recent Labs Lab 01/28/16 2000 01/29/16 0033 01/29/16 0359 01/29/16 0726 01/29/16 1203  GLUCAP 191* 164* 166* 123* 148*   Lipid Profile: No results for input(s): CHOL, HDL, LDLCALC, TRIG, CHOLHDL, LDLDIRECT in the last 72 hours. Thyroid Function Tests: No results for input(s): TSH, T4TOTAL, FREET4, T3FREE, THYROIDAB in the last 72 hours. Anemia Panel: No results for input(s): VITAMINB12, FOLATE, FERRITIN, TIBC, IRON, RETICCTPCT in the last 72 hours. Sepsis Labs:  Recent Labs Lab 01/26/16 0353  LATICACIDVEN 1.7    Recent Results (from the past 240 hour(s))  MRSA PCR Screening     Status: None   Collection Time: 01/26/16  2:14 AM  Result Value Ref Range Status   MRSA by PCR NEGATIVE NEGATIVE Final    Comment:        The GeneXpert MRSA Assay (FDA approved for NASAL specimens only), is one component of a comprehensive MRSA colonization surveillance program. It is not intended to diagnose MRSA infection nor to guide or monitor treatment for MRSA infections.          Radiology Studies: US Pelvis Limited  Result Date: 01/29/2016 CLINICAL DATA:  Evaluate for left groin pain for the past days, possible inguinal hernia. EXAM: LIMITED ULTRASOUND OF  PELVIS TECHNIQUE: Limited transabdominal ultrasound examination of the pelvis was performed. COMPARISON:  Abdominal and pelvic CT scan dated April 28, 2013 and abdominal ultrasound of January 27, 2016 FINDINGS: Ascites is present in the left lower quadrant and is centered over the area of pain. In the left groin no definite hernia is observed. IMPRESSION: There is ascites in the left lower quadrant of the abdomen. No inguinal hernia is observed.  If further imaging is felt indicated clinically, abdominal and pelvic CT scanning would be the most useful next imaging step. Electronically Signed   By: David  Martinique M.D.   On: 01/29/2016 12:20   US Abdomen Limited Ruq  Result Date: 01/28/2016 CLINICAL DATA:  Assess for ascites. EXAM: US ABDOMEN LIMITED - RIGHT UPPER QUADRANT COMPARISON:  None. FINDINGS: Gallbladder: The gallbladder is mildly distended. The gallbladder wall measures 3.1 mm which is borderline. An 8 mm stone is identified with no Murphy's sign or pericholecystic fluid. Common bile duct: Diameter: 5.3 mm Liver: The liver is heterogeneous. By report, the patient has liver metastases but no focal masses are seen on this study. There is an apparent mildly nodular contour to the liver. There is adjacent ascites. IMPRESSION: 1. The liver is heterogeneous with suggestion of a nodular contour. There is adjacent ascites. Despite the history of metastatic disease, no discrete masses were imaged by the sonographer. 2. The gallbladder is mildly distended and the gallbladder wall measures 3.1 mm which is borderline. A single 8 mm stone is seen with no Murphy's sign. Electronically Signed   By: Dorise Bullion III M.D   On: 01/28/2016 01:51        Scheduled Meds: . amLODipine  10 mg Oral Daily  . ciprofloxacin  500 mg Oral BID  . insulin aspart  0-15 Units Subcutaneous Q4H  . ketotifen  1 drop Both Eyes BID  . mouth rinse  15 mL Mouth Rinse BID  . pantoprazole  40 mg Oral Daily  . polyethylene glycol   17 g Oral BID  . pregabalin  75 mg Oral BID  . ramipril  10 mg Oral BID   Continuous Infusions:     LOS: 3 days    Time spent: 30 minutes.     Hosie Poisson, MD [facility] Pager [phone]  If 7PM-7AM, please contact night-coverage [email or web address] Password [email or web address] 01/29/2016, 5:14 PM

## 2016-01-29 NOTE — Consult Note (Signed)
[facility] Surgery Consult Note  James Russell 03-27-1961  [id number].    Requesting MD: Karleen Hampshire Chief Complaint/Reason for Consult: Left inguinal hernia  HPI:  James Russell is a 55yo male PMH stage IV colon cancer with metastasis to the liver and lung, recently admitted to [facility] for an acute GI bleed found to be variceal bleed on EGD; variceal band ligation performed. He is currently stable from this. States that 2 days ago he was getting out of bed and felt a sharp burning sensation in his left groin. At the time it was severe but has since improved with rest. The pain returns when he tries to stand or walk. Denies any change in his bowel habits. Last BM was 2 days ago. Denies nausea, vomiting, or dysuria. States that he has never had pain like this before. He does have a prior h/o umbilical hernia that was surgically fixed at [facility] in 2015, but denies h/o inguinal hernia.  PMH significant for stage IV colon cancer with metastasis to the liver and lung (currently undergoing chemotherapy Avastin/Xeloda under the care of Dr. Nelda Marseille at [facility]), HTN, HLD, CAD with h/o multiple stent placement, GERD, hyperglycemia, anxiety Abdominal surgical history includes laparoscopic right hemicolectomy on 06/08/2014 Dr. Crisoforo Oxford at [facility], umbilical hernia repair ~9702, appendectomy Takes ASA '325mg'$  daily at home Smokes 1-2 cigarettes/day Employment: phlebotomist at [employer]  ROS: All systems reviewed and otherwise negative except for as above  History reviewed. No pertinent family history.  Past Medical History:  Diagnosis Date  . Cancer ([place])    liver  . H. pylori infection   . Hypercholesteremia   . Hypertension   . Iron deficiency   . MI (myocardial infarction)     Past Surgical History:  Procedure Laterality Date  . CORONARY ARTERY BYPASS GRAFT    . ESOPHAGOGASTRODUODENOSCOPY N/A 01/25/2016   Procedure: ESOPHAGOGASTRODUODENOSCOPY (EGD);  Surgeon: Teena Irani, MD;  Location: [facility];  Service:  Endoscopy;  Laterality: N/A;  . implanted port      Social History:  reports that he has been smoking.  He has never used smokeless tobacco. He reports that he does not drink alcohol or use drugs.  Allergies:  Allergies  Allergen Reactions  . Tylenol [Acetaminophen]     Chills    Medications Prior to Admission  Medication Sig Dispense Refill  . ALPRAZolam (XANAX) 0.5 MG tablet Take 0.5 mg by mouth at bedtime as needed for anxiety.    Marland Kitchen amLODipine (NORVASC) 10 MG tablet Take 10 mg by mouth daily.    Marland Kitchen aspirin 81 MG chewable tablet Chew 81 mg by mouth daily.    Marland Kitchen atenolol (TENORMIN) 100 MG tablet Take 50 mg by mouth daily.    Marland Kitchen atorvastatin (LIPITOR) 80 MG tablet Take 80 mg by mouth daily.    . nitroGLYCERIN (NITROSTAT) 0.4 MG SL tablet Place 0.4 mg under the tongue every 5 (five) minutes as needed for chest pain.    Marland Kitchen omeprazole (PRILOSEC) 40 MG capsule Take 40 mg by mouth daily.    . ondansetron (ZOFRAN-ODT) 8 MG disintegrating tablet Take 8 mg by mouth every 8 (eight) hours as needed for nausea or vomiting.    Marland Kitchen oxyCODONE (OXY IR/ROXICODONE) 5 MG immediate release tablet Take 10 mg by mouth every 4 (four) hours as needed for severe pain.    . pregabalin (LYRICA) 75 MG capsule Take 75 mg by mouth 2 (two) times daily.    . ramipril (ALTACE) 5 MG capsule Take 10 mg  by mouth 2 (two) times daily.    Marland Kitchen zolpidem (AMBIEN) 10 MG tablet Take 10 mg by mouth at bedtime as needed for sleep.    . ciprofloxacin (CIPRO) 500 MG tablet Take 1 tablet (500 mg total) by mouth 2 (two) times daily. One po bid x 7 days (Patient not taking: Reported on 01/25/2016) 14 tablet 0  . docusate sodium (COLACE) 100 MG capsule Take 1 capsule (100 mg total) by mouth every 12 (twelve) hours. (Patient not taking: Reported on 01/25/2016) 60 capsule 0  . metroNIDAZOLE (FLAGYL) 500 MG tablet Take 1 tablet (500 mg total) by mouth 2 (two) times daily. One po bid x 7 days (Patient not taking: Reported on 01/25/2016) 14 tablet 0   . oxyCODONE-acetaminophen (PERCOCET) 5-325 MG per tablet Take 1 tablet by mouth every 6 (six) hours as needed for moderate pain. (Patient not taking: Reported on 01/25/2016) 20 tablet 0    Blood pressure (!) 148/67, pulse 94, temperature 98.4 F (36.9 C), resp. rate 13, height '5\' 11"'$  (1.803 m), weight 213 lb 13.5 oz (97 kg), SpO2 98 %. Physical Exam: General: pleasant, WD/WN white male who is laying in bed in NAD HEENT: head is normocephalic, atraumatic.  Sclera are noninjected.  Mouth is pink and moist Heart: regular, rate, and rhythm.  No obvious murmurs, gallops, or rubs noted.  Palpable pedal pulses bilaterally Lungs: CTAB, no wheezes, rhonchi, or rales noted.  Respiratory effort nonlabored Abd: soft, NT/ND, no masses or organomegaly. Palpable left inguinal hernia when patient is standing and coughs MS: all 4 extremities are symmetrical with no cyanosis, clubbing, or edema. Skin: warm and dry with no masses, lesions, or rashes Psych: A&Ox3 with an appropriate affect. Neuro: CM 2-12 intact, extremity CSM intact bilaterally, normal speech  Results for orders placed or performed during the hospital encounter of 01/25/16 (from the past 48 hour(s))  Glucose, capillary     Status: Abnormal   Collection Time: 01/27/16 12:53 PM  Result Value Ref Range   Glucose-Capillary 259 (H) 65 - 99 mg/dL  Glucose, capillary     Status: Abnormal   Collection Time: 01/27/16  4:17 PM  Result Value Ref Range   Glucose-Capillary 142 (H) 65 - 99 mg/dL  Glucose, capillary     Status: None   Collection Time: 01/27/16  7:57 PM  Result Value Ref Range   Glucose-Capillary 96 65 - 99 mg/dL  Glucose, capillary     Status: Abnormal   Collection Time: 01/27/16 11:25 PM  Result Value Ref Range   Glucose-Capillary 185 (H) 65 - 99 mg/dL  Glucose, capillary     Status: Abnormal   Collection Time: 01/28/16  1:07 AM  Result Value Ref Range   Glucose-Capillary 180 (H) 65 - 99 mg/dL  Glucose, capillary     Status:  Abnormal   Collection Time: 01/28/16  3:41 AM  Result Value Ref Range   Glucose-Capillary 185 (H) 65 - 99 mg/dL   Comment 1 Notify RN   CBC     Status: Abnormal   Collection Time: 01/28/16  5:00 AM  Result Value Ref Range   WBC 4.9 4.0 - 10.5 K/uL   RBC 2.67 (L) 4.22 - 5.81 MIL/uL   Hemoglobin 7.2 (L) 13.0 - 17.0 g/dL   HCT 22.4 (L) 39.0 - 52.0 %   MCV 83.9 78.0 - 100.0 fL   MCH 27.0 26.0 - 34.0 pg   MCHC 32.1 30.0 - 36.0 g/dL   RDW 18.0 (H) 11.5 - 15.5 %  Platelets 72 (L) 150 - 400 K/uL    Comment: CONSISTENT WITH PREVIOUS RESULT  Basic metabolic panel     Status: Abnormal   Collection Time: 01/28/16  5:00 AM  Result Value Ref Range   Sodium 137 135 - 145 mmol/L   Potassium 3.4 (L) 3.5 - 5.1 mmol/L   Chloride 113 (H) 101 - 111 mmol/L   CO2 21 (L) 22 - 32 mmol/L   Glucose, Bld 136 (H) 65 - 99 mg/dL   BUN 13 6 - 20 mg/dL   Creatinine, Ser 0.78 0.61 - 1.24 mg/dL   Calcium 7.3 (L) 8.9 - 10.3 mg/dL   GFR calc non Af Amer >60 >60 mL/min   GFR calc Af Amer >60 >60 mL/min    Comment: (NOTE) The eGFR has been calculated using the CKD EPI equation. This calculation has not been validated in all clinical situations. eGFR's persistently <60 mL/min signify possible Chronic Kidney Disease.    Anion gap 3 (L) 5 - 15  Magnesium     Status: None   Collection Time: 01/28/16  5:00 AM  Result Value Ref Range   Magnesium 1.9 1.7 - 2.4 mg/dL  Phosphorus     Status: Abnormal   Collection Time: 01/28/16  5:00 AM  Result Value Ref Range   Phosphorus 2.1 (L) 2.5 - 4.6 mg/dL  Glucose, capillary     Status: Abnormal   Collection Time: 01/28/16  7:35 AM  Result Value Ref Range   Glucose-Capillary 132 (H) 65 - 99 mg/dL   Comment 1 Notify RN    Comment 2 Document in Chart   Glucose, capillary     Status: Abnormal   Collection Time: 01/28/16 12:20 PM  Result Value Ref Range   Glucose-Capillary 142 (H) 65 - 99 mg/dL   Comment 1 Notify RN    Comment 2 Document in Chart   Prepare RBC      Status: None   Collection Time: 01/28/16  1:00 PM  Result Value Ref Range   Order Confirmation ORDER PROCESSED BY BLOOD BANK   Glucose, capillary     Status: Abnormal   Collection Time: 01/28/16  4:46 PM  Result Value Ref Range   Glucose-Capillary 137 (H) 65 - 99 mg/dL   Comment 1 Notify RN    Comment 2 Document in Chart   Glucose, capillary     Status: Abnormal   Collection Time: 01/28/16  8:00 PM  Result Value Ref Range   Glucose-Capillary 191 (H) 65 - 99 mg/dL  Glucose, capillary     Status: Abnormal   Collection Time: 01/29/16 12:33 AM  Result Value Ref Range   Glucose-Capillary 164 (H) 65 - 99 mg/dL  Glucose, capillary     Status: Abnormal   Collection Time: 01/29/16  3:59 AM  Result Value Ref Range   Glucose-Capillary 166 (H) 65 - 99 mg/dL  CBC     Status: Abnormal   Collection Time: 01/29/16  5:42 AM  Result Value Ref Range   WBC 6.4 4.0 - 10.5 K/uL   RBC 3.22 (L) 4.22 - 5.81 MIL/uL   Hemoglobin 8.9 (L) 13.0 - 17.0 g/dL   HCT 27.3 (L) 39.0 - 52.0 %   MCV 84.8 78.0 - 100.0 fL   MCH 27.6 26.0 - 34.0 pg   MCHC 32.6 30.0 - 36.0 g/dL   RDW 17.6 (H) 11.5 - 15.5 %   Platelets 80 (L) 150 - 400 K/uL    Comment: CONSISTENT WITH PREVIOUS RESULT  Basic metabolic panel     Status: Abnormal   Collection Time: 01/29/16  5:42 AM  Result Value Ref Range   Sodium 137 135 - 145 mmol/L   Potassium 3.9 3.5 - 5.1 mmol/L   Chloride 113 (H) 101 - 111 mmol/L   CO2 21 (L) 22 - 32 mmol/L   Glucose, Bld 133 (H) 65 - 99 mg/dL   BUN 10 6 - 20 mg/dL   Creatinine, Ser 0.68 0.61 - 1.24 mg/dL   Calcium 7.4 (L) 8.9 - 10.3 mg/dL   GFR calc non Af Amer >60 >60 mL/min   GFR calc Af Amer >60 >60 mL/min    Comment: (NOTE) The eGFR has been calculated using the CKD EPI equation. This calculation has not been validated in all clinical situations. eGFR's persistently <60 mL/min signify possible Chronic Kidney Disease.    Anion gap 3 (L) 5 - 15  Phosphorus     Status: None   Collection Time:  01/29/16  5:42 AM  Result Value Ref Range   Phosphorus 2.6 2.5 - 4.6 mg/dL  Glucose, capillary     Status: Abnormal   Collection Time: 01/29/16  7:26 AM  Result Value Ref Range   Glucose-Capillary 123 (H) 65 - 99 mg/dL   US Abdomen Limited Ruq  Result Date: 01/28/2016 CLINICAL DATA:  Assess for ascites. EXAM: US ABDOMEN LIMITED - RIGHT UPPER QUADRANT COMPARISON:  None. FINDINGS: Gallbladder: The gallbladder is mildly distended. The gallbladder wall measures 3.1 mm which is borderline. An 8 mm stone is identified with no Murphy's sign or pericholecystic fluid. Common bile duct: Diameter: 5.3 mm Liver: The liver is heterogeneous. By report, the patient has liver metastases but no focal masses are seen on this study. There is an apparent mildly nodular contour to the liver. There is adjacent ascites. IMPRESSION: 1. The liver is heterogeneous with suggestion of a nodular contour. There is adjacent ascites. Despite the history of metastatic disease, no discrete masses were imaged by the sonographer. 2. The gallbladder is mildly distended and the gallbladder wall measures 3.1 mm which is borderline. A single 8 mm stone is seen with no Murphy's sign. Electronically Signed   By: Dorise Bullion III M.D   On: 01/28/2016 01:51      Assessment/Plan Left inguinal hernia - injury occurred getting out of bed 2 days ago - hernia palpable on exam - hernia is not incarcerated as patient's bowel function is normal and he has no n/v  Stage IV colon cancer with metastasis to the liver and lung (currently undergoing chemotherapy Avastin/Xeloda under the care of Dr. Nelda Marseille at [facility]) HTN HLD CAD with h/o multiple stent placement GERD Hyperglycemia Anxiety  Plan - Not a candidate for elective surgical repair at this time. Ice and rest for pain relief. Recommend outpatient follow-up with general surgery once patient has recovered from his other more pressing medical problems. Will review with MD.  Jerrye Beavers, [facility] Surgery 01/29/2016, 11:07 AM Pager: [phone] Consults: [phone] Mon-Fri 7:00 am-4:30 pm Sat-Sun 7:00 am-11:30 am

## 2016-01-29 NOTE — Progress Notes (Signed)
[facility] PROGRESS NOTE Subjective no gross bleeding tolerating full liquids  Objective: Vital signs in last 24 hours: Temp:  [97.9 F (36.6 C)-98.7 F (37.1 C)] 98.4 F (36.9 C) (10/30 0802) Pulse Rate:  [84-101] 94 (10/30 0700) Resp:  [10-23] 13 (10/30 0700) BP: (130-167)/(65-92) 148/67 (10/30 0700) SpO2:  [94 %-99 %] 98 % (10/30 0700) Weight:  [97 kg (213 lb 13.5 oz)] 97 kg (213 lb 13.5 oz) (10/30 0500) Last BM Date: 01/27/16  Intake/Output from previous day: 10/29 0701 - 10/30 0700 In: 2453.3 [I.V.:2061.3; Blood:392] Out: 450 [Urine:450] Intake/Output this shift: Total I/O In: 75 [I.V.:75] Out: -   PE: General-- alert no distress  Abdomen-- soft nontender  Lab Results:  Recent Labs  01/26/16 1445 01/26/16 1840 01/27/16 0859 01/28/16 0500 01/29/16 0542  WBC  --   --  5.3 4.9 6.4  HGB 7.8* 8.1* 7.6* 7.2* 8.9*  HCT 23.2* 24.5* 23.2* 22.4* 27.3*  PLT  --   --  65* 72* 80*   BMET  Recent Labs  01/26/16 1445 01/27/16 0859 01/28/16 0500 01/29/16 0542  NA 136 137 137 137  K 4.3 3.8 3.4* 3.9  CL 114* 113* 113* 113*  CO2 20* 20* 21* 21*  CREATININE 0.77 0.71 0.78 0.68   LFT No results for input(s): PROT, AST, ALT, ALKPHOS, BILITOT, BILIDIR, IBILI in the last 72 hours. PT/INR No results for input(s): LABPROT, INR in the last 72 hours. PANCREAS No results for input(s): LIPASE in the last 72 hours.       Studies/Results: US Abdomen Limited Ruq  Result Date: 01/28/2016 CLINICAL DATA:  Assess for ascites. EXAM: US ABDOMEN LIMITED - RIGHT UPPER QUADRANT COMPARISON:  None. FINDINGS: Gallbladder: The gallbladder is mildly distended. The gallbladder wall measures 3.1 mm which is borderline. An 8 mm stone is identified with no Murphy's sign or pericholecystic fluid. Common bile duct: Diameter: 5.3 mm Liver: The liver is heterogeneous. By report, the patient has liver metastases but no focal masses are seen on this study. There is an apparent  mildly nodular contour to the liver. There is adjacent ascites. IMPRESSION: 1. The liver is heterogeneous with suggestion of a nodular contour. There is adjacent ascites. Despite the history of metastatic disease, no discrete masses were imaged by the sonographer. 2. The gallbladder is mildly distended and the gallbladder wall measures 3.1 mm which is borderline. A single 8 mm stone is seen with no Murphy's sign. Electronically Signed   By: Dorise Bullion III M.D   On: 01/28/2016 01:51    Medications: I have reviewed the patient's current medications.  Assessment/Plan: 1. Esophageal variceal bleeding. Currently appears to be stable. I have discussed with him the fact that he will need repeat EGD and banding by Dr. Amedeo Plenty at some point in the future approximately 2 weeks or so. We could probably go ahead and advanced to soft diet. He will need to be discharged on PPI.   Alyissa Whidbee JR,Razia Screws L 01/29/2016, 9:28 AM  This note was created using voice recognition software. Minor errors may Have occurred unintentionally.  Pager: [phone] If no answer or after hours call [phone]

## 2016-01-29 NOTE — Progress Notes (Signed)
Date:  January 29, 2016 Chart reviewed for concurrent status and case management needs. Will continue to follow the patient for status change: Discharge Planning: following for needs Expected discharge date: [id number] Velva Harman, BSN, [place], [place]

## 2016-01-30 DIAGNOSIS — K4090 Unilateral inguinal hernia, without obstruction or gangrene, not specified as recurrent: Secondary | ICD-10-CM

## 2016-01-30 LAB — GLUCOSE, CAPILLARY
GLUCOSE-CAPILLARY: 198 mg/dL — AB (ref 65–99)
GLUCOSE-CAPILLARY: 131 mg/dL — AB (ref 65–99)
GLUCOSE-CAPILLARY: 167 mg/dL — AB (ref 65–99)
GLUCOSE-CAPILLARY: 177 mg/dL — AB (ref 65–99)
Glucose-Capillary: 114 mg/dL — ABNORMAL HIGH (ref 65–99)
Glucose-Capillary: 120 mg/dL — ABNORMAL HIGH (ref 65–99)

## 2016-01-30 LAB — CBC
HCT: 27.7 % — ABNORMAL LOW (ref 39.0–52.0)
Hemoglobin: 9.2 g/dL — ABNORMAL LOW (ref 13.0–17.0)
MCH: 27.8 pg (ref 26.0–34.0)
MCHC: 33.2 g/dL (ref 30.0–36.0)
MCV: 83.7 fL (ref 78.0–100.0)
PLATELETS: 81 10*3/uL — AB (ref 150–400)
RBC: 3.31 MIL/uL — AB (ref 4.22–5.81)
RDW: 18.8 % — ABNORMAL HIGH (ref 11.5–15.5)
WBC: 5.3 10*3/uL (ref 4.0–10.5)

## 2016-01-30 MED ORDER — FUROSEMIDE 40 MG PO TABS
40.0000 mg | ORAL_TABLET | Freq: Every day | ORAL | Status: DC
  Administered 2016-01-30 – 2016-01-31 (×2): 40 mg via ORAL
  Filled 2016-01-30 (×2): qty 1

## 2016-01-30 NOTE — Progress Notes (Signed)
[facility] PROGRESS NOTE Subjective patient tolerating soft diet with no signs of further bleeding  Objective: Vital signs in last 24 hours: Temp:  [98.5 F (36.9 C)-98.8 F (37.1 C)] 98.5 F (36.9 C) (10/31 0418) Pulse Rate:  [89-113] 105 (10/31 0418) Resp:  [13-23] 18 (10/31 0418) BP: (109-153)/(58-79) 132/77 (10/31 0418) SpO2:  [96 %-100 %] 100 % (10/31 0418) Weight:  [98.9 kg (218 lb)] 98.9 kg (218 lb) (10/30 1626) Last BM Date: 01/28/16  Intake/Output from previous day: 10/30 0701 - 10/31 0700 In: 1389 [P.O.:1040; I.V.:349] Out: 175 [Urine:175] Intake/Output this shift: No intake/output data recorded.  PE: General-- alert and oriented watching television  Abdomen-- enlarged liver nondistended  Lab Results:  Recent Labs  01/27/16 0859 01/28/16 0500 01/29/16 0542  WBC 5.3 4.9 6.4  HGB 7.6* 7.2* 8.9*  HCT 23.2* 22.4* 27.3*  PLT 65* 72* 80*   BMET  Recent Labs  01/27/16 0859 01/28/16 0500 01/29/16 0542  NA 137 137 137  K 3.8 3.4* 3.9  CL 113* 113* 113*  CO2 20* 21* 21*  CREATININE 0.71 0.78 0.68   LFT No results for input(s): PROT, AST, ALT, ALKPHOS, BILITOT, BILIDIR, IBILI in the last 72 hours. PT/INR No results for input(s): LABPROT, INR in the last 72 hours. PANCREAS No results for input(s): LIPASE in the last 72 hours.       Studies/Results: US Pelvis Limited  Result Date: 01/29/2016 CLINICAL DATA:  Evaluate for left groin pain for the past days, possible inguinal hernia. EXAM: LIMITED ULTRASOUND OF PELVIS TECHNIQUE: Limited transabdominal ultrasound examination of the pelvis was performed. COMPARISON:  Abdominal and pelvic CT scan dated April 28, 2013 and abdominal ultrasound of January 27, 2016 FINDINGS: Ascites is present in the left lower quadrant and is centered over the area of pain. In the left groin no definite hernia is observed. IMPRESSION: There is ascites in the left lower quadrant of the abdomen. No inguinal hernia  is observed. If further imaging is felt indicated clinically, abdominal and pelvic CT scanning would be the most useful next imaging step. Electronically Signed   By: David  Martinique M.D.   On: 01/29/2016 12:20    Medications: I have reviewed the patient's current medications.  Assessment/Plan: 1. 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 2. Esophageal variceal bleeding. Probably due to portal hypertension for massive liver metastasis. No further bleeding after recent esophageal banding. Long discussion with the patient and he will need further esophageal banding. This can best be done in a week or 2 hopefully as an outpatient. Dr. Amedeo Plenty will contact him to set this up. We will sign off for now, please call us for further problems.   Yassmine Tamm JR,Sameria Morss L 01/30/2016, 8:07 AM  This note was created using voice recognition software. Minor errors may Have occurred unintentionally.  Pager: [phone] If no answer or after hours call [phone]

## 2016-01-30 NOTE — Progress Notes (Signed)
PROGRESS NOTE    James Russell  [id number] DOB: 1960-06-08 DOA: 01/25/2016 PCP: Pcp Not In System    Brief Narrative: ALCIDES Russell is a 55 y.o. male with PMH as outlined below including recently diagnosed Stage IV colon CA with mets to liver and lung, currently undergoing chemotherapy (Avastin/Xeloda under the care of Dr. Nelda Marseille at [facility], presents with nausea, and generalized weakness, and 2 episodes of hematemesis.  He was seen by Dr. Amedeo Plenty of GI and had emergent EGD performed which demonstrated 4 columns of recently bleeding grade III mid - distal esophageal varices along with clotted blood in the stomach..  He had 3 bands placed with incomplete eradication of the varices and slow ooze remained after the procedure. He was initially admitted BY PCCM service and transferred to [facility] service on 10/29.   Assessment & Plan:   Active Problems:   GI bleed   GI bleed from esophageal varices, s/p banding on 10/27, .  Monitor hemoglobin, one unit of prbc transfusion ordered. Repeat hemoglobin 8.9- 9.3  Started on clears and advance as tolerated. Currently on soft diet.   Prophylactic antibiotic, on rocephin, change to oral ciprofloxacin to complete the course.  Monitor hemoglobin in am.  GI plans to do further esophageal banding in a week or two depending on their schedule.   Anxiety: resume xanax, pregabalin, zolpidem  Anemia of blood loss: - monitor and transfuse to keep it greater than 7.    Metastatic colon cancer with liver and lung mets: S/p chemo, follows with Dr Nelda Marseille at [facility].    Left inguinal pain: suspect inguinal hernia.  Surgery consulted for recommendations. And pain control.    Allergic conjunctivitis: ketotifen eye drops ordered.   Thrombocytopenia: suspect possibly from liver disease.    DVT prophylaxis: (SCD'S Code Status: (Full/) Family Communication: son at bedside.  Disposition Plan:  Pending PT evaluation and when his pain is controlled.   Consultants:     GASTROENTEROLOGY.    Procedures: EGD on 10/27   Antimicrobials: none.    Subjective: Groin pain is better.   Objective: Vitals:   01/29/16 1626 01/29/16 2028 01/30/16 0418 01/30/16 1500  BP: 129/71 123/76 132/77 113/67  Pulse: (!) 108 (!) 108 (!) 105 99  Resp: 20 18 18 20   Temp: 98.8 F (37.1 C) 98.5 F (36.9 C) 98.5 F (36.9 C) 98.2 F (36.8 C)  TempSrc: Oral Oral Oral Oral  SpO2: 99% 98% 100% 98%  Weight: 98.9 kg (218 lb)     Height: 5\' 11"  (1.803 m)       Intake/Output Summary (Last 24 hours) at 01/30/16 1827 Last data filed at 01/30/16 1230  Gross per 24 hour  Intake             1330 ml  Output              175 ml  Net             1155 ml   Filed Weights   01/28/16 0500 01/29/16 0500 01/29/16 1626  Weight: 94.4 kg (208 lb 1.8 oz) 97 kg (213 lb 13.5 oz) 98.9 kg (218 lb)    Examination:  General exam: Appears calm and comfortable  Respiratory system: Clear to auscultation. Respiratory effort normal. Cardiovascular system: S1 & S2 heard, RRR. No JVD, murmurs, rubs, gallops or clicks.Gastrointestinal system: Abdomen is nondistended, soft and nontender. No organomegaly or masses felt. Normal bowel sounds heard. Central nervous system: Alert and oriented. No focal neurological deficits.  Extremities: bilateral pedal edema.  Skin: No rashes, lesions or ulcers Psychiatry: Judgement and insight appear normal. Mood & affect appropriate.     Data Reviewed: I have personally reviewed following labs and imaging studies  CBC:  Recent Labs Lab 01/25/16 2134  01/26/16 1840 01/27/16 0859 01/28/16 0500 01/29/16 0542 01/30/16 1630  WBC 10.9*  --   --  5.3 4.9 6.4 5.3  HGB 7.4*  < > 8.1* 7.6* 7.2* 8.9* 9.2*  HCT 23.1*  < > 24.5* 23.2* 22.4* 27.3* 27.7*  MCV 82.2  --   --  83.5 83.9 84.8 83.7  PLT 131*  --   --  65* 72* 80* 81*  < > = values in this interval not displayed. Basic Metabolic Panel:  Recent Labs Lab 01/26/16 0335 01/26/16 1445 01/27/16 0859  01/28/16 0500 01/29/16 0542  NA 136 136 137 137 137  K 5.2* 4.3 3.8 3.4* 3.9  CL 114* 114* 113* 113* 113*  CO2 19* 20* 20* 21* 21*  GLUCOSE 180* 158* 154* 136* 133*  BUN 15 17 16 13 10   CREATININE 0.76 0.77 0.71 0.78 0.68  CALCIUM 7.2* 7.0* 7.3* 7.3* 7.4*  MG 1.7  --   --  1.9  --   PHOS 2.7  --   --  2.1* 2.6   GFR: Estimated Creatinine Clearance: 122.1 mL/min (by C-G formula based on SCr of 0.68 mg/dL). Liver Function Tests:  Recent Labs Lab 01/25/16 2134  AST 37  ALT 32  ALKPHOS 111  BILITOT 1.2  PROT 4.8*  ALBUMIN 2.5*    Recent Labs Lab 01/25/16 2134  LIPASE 77*   No results for input(s): AMMONIA in the last 168 hours. Coagulation Profile:  Recent Labs Lab 01/25/16 2135  INR 1.32   Cardiac Enzymes:  Recent Labs Lab 01/26/16 0335 01/26/16 1445  TROPONINI <0.03 <0.03   BNP (last 3 results) No results for input(s): PROBNP in the last 8760 hours. HbA1C: No results for input(s): HGBA1C in the last 72 hours. CBG:  Recent Labs Lab 01/30/16 0006 01/30/16 0411 01/30/16 0748 01/30/16 1119 01/30/16 1631  GLUCAP 120* 198* 131* 114* 167*   Lipid Profile: No results for input(s): CHOL, HDL, LDLCALC, TRIG, CHOLHDL, LDLDIRECT in the last 72 hours. Thyroid Function Tests: No results for input(s): TSH, T4TOTAL, FREET4, T3FREE, THYROIDAB in the last 72 hours. Anemia Panel: No results for input(s): VITAMINB12, FOLATE, FERRITIN, TIBC, IRON, RETICCTPCT in the last 72 hours. Sepsis Labs:  Recent Labs Lab 01/26/16 0353  LATICACIDVEN 1.7    Recent Results (from the past 240 hour(s))  MRSA PCR Screening     Status: None   Collection Time: 01/26/16  2:14 AM  Result Value Ref Range Status   MRSA by PCR NEGATIVE NEGATIVE Final    Comment:        The GeneXpert MRSA Assay (FDA approved for NASAL specimens only), is one component of a comprehensive MRSA colonization surveillance program. It is not intended to diagnose MRSA infection nor to guide  or monitor treatment for MRSA infections.          Radiology Studies: US Pelvis Limited  Result Date: 01/29/2016 CLINICAL DATA:  Evaluate for left groin pain for the past days, possible inguinal hernia. EXAM: LIMITED ULTRASOUND OF PELVIS TECHNIQUE: Limited transabdominal ultrasound examination of the pelvis was performed. COMPARISON:  Abdominal and pelvic CT scan dated April 28, 2013 and abdominal ultrasound of January 27, 2016 FINDINGS: Ascites is present in the left lower quadrant and is centered  over the area of pain. In the left groin no definite hernia is observed. IMPRESSION: There is ascites in the left lower quadrant of the abdomen. No inguinal hernia is observed. If further imaging is felt indicated clinically, abdominal and pelvic CT scanning would be the most useful next imaging step. Electronically Signed   By: David  Martinique M.D.   On: 01/29/2016 12:20        Scheduled Meds: . amLODipine  10 mg Oral Daily  . ciprofloxacin  500 mg Oral BID  . furosemide  40 mg Oral Daily  . insulin aspart  0-15 Units Subcutaneous Q4H  . ketotifen  1 drop Both Eyes BID  . mouth rinse  15 mL Mouth Rinse BID  . pantoprazole  40 mg Oral Daily  . polyethylene glycol  17 g Oral BID  . pregabalin  75 mg Oral BID  . ramipril  10 mg Oral BID   Continuous Infusions:     LOS: 4 days    Time spent: 30 minutes.     Hosie Poisson, MD [facility] Pager [phone]  If 7PM-7AM, please contact night-coverage [email or web address] Password [facility] 01/30/2016, 6:27 PM

## 2016-01-30 NOTE — Progress Notes (Signed)
Initial Nutrition Assessment  INTERVENTION:   Encourage small ,frequent meals emphasizing protein consumption RD to continue to monitor for needs  NUTRITION DIAGNOSIS:   Increased nutrient needs related to cancer and cancer related treatments as evidenced by estimated needs.  GOAL:   Patient will meet greater than or equal to 90% of their needs  MONITOR:   PO intake, Labs, Weight trends, I & O's  REASON FOR ASSESSMENT:   Malnutrition Screening Tool   ASSESSMENT:   55 y.o. male with PMH as outlined below including recently diagnosed Stage IV colon CA with mets to liver and lung, currently undergoing chemotherapy (Avastin/Xeloda under the care of Dr. Nelda Marseille at [facility].  Has also been on FOLFORI in the past).  He presented to [facility] ED 10/26 feeling weak and nauseated.  Symptoms began after eating supper earlier that evening.  He then had 2 episodes of hematemesis while at home.  Patient in room with son at bedside. Pt's son states pt's appetite is a little better today. He ate some macaroni and cheese and mashed potatoes from Mrs. Winners, also a banana and cooked squash from the kitchen. Pt denies any swallowing or chewing issues despite missing teeth. He denies taste changes. He does not use protein supplements at home. PO intake: 75%.  Patient states he takes Lasix at home, last dose was Thursday 10/26. Since admission his weight has steadily increased, now +33 lb. Pt complains of pain from swelling. Notified MD.  Nutrition focused physical exam shows no sign of depletion of muscle mass or body fat.  Medications: Miralax packet BID Labs reviewed: CBGs: 114-198 Phos WNL  Diet Order:  Diet regular Room service appropriate? Yes; Fluid consistency: Thin  Skin:  Reviewed, no issues  Last BM:  10/29  Height:   Ht Readings from Last 1 Encounters:  01/29/16 5\' 11"  (1.803 m)    Weight:   Wt Readings from Last 1 Encounters:  01/29/16 218 lb (98.9 kg)    Ideal Body Weight:   78.2 kg  BMI:  Body mass index is 30.4 kg/m.  Estimated Nutritional Needs:   Kcal:  2400-2600  Protein:  120-130g  Fluid:  2-2.2L/day  EDUCATION NEEDS:   No education needs identified at this time  Clayton Bibles, MS, RD, LDN Pager: [phone] After Hours Pager: [phone]

## 2016-01-31 DIAGNOSIS — G620 Drug-induced polyneuropathy: Secondary | ICD-10-CM

## 2016-01-31 DIAGNOSIS — C189 Malignant neoplasm of colon, unspecified: Secondary | ICD-10-CM

## 2016-01-31 DIAGNOSIS — K4090 Unilateral inguinal hernia, without obstruction or gangrene, not specified as recurrent: Secondary | ICD-10-CM

## 2016-01-31 DIAGNOSIS — T451X5A Adverse effect of antineoplastic and immunosuppressive drugs, initial encounter: Secondary | ICD-10-CM

## 2016-01-31 DIAGNOSIS — C787 Secondary malignant neoplasm of liver and intrahepatic bile duct: Secondary | ICD-10-CM

## 2016-01-31 DIAGNOSIS — I8511 Secondary esophageal varices with bleeding: Secondary | ICD-10-CM

## 2016-01-31 DIAGNOSIS — D696 Thrombocytopenia, unspecified: Secondary | ICD-10-CM

## 2016-01-31 LAB — GLUCOSE, CAPILLARY
GLUCOSE-CAPILLARY: 119 mg/dL — AB (ref 65–99)
GLUCOSE-CAPILLARY: 162 mg/dL — AB (ref 65–99)
GLUCOSE-CAPILLARY: 181 mg/dL — AB (ref 65–99)
Glucose-Capillary: 146 mg/dL — ABNORMAL HIGH (ref 65–99)
Glucose-Capillary: 231 mg/dL — ABNORMAL HIGH (ref 65–99)
Glucose-Capillary: 199 mg/dL — ABNORMAL HIGH (ref 65–99)

## 2016-01-31 MED ORDER — KETOTIFEN FUMARATE 0.025 % OP SOLN: 1.0000 [drp] | Freq: Two times a day (BID) | OPHTHALMIC | 0 refills | Status: DC

## 2016-01-31 MED ORDER — FUROSEMIDE 10 MG/ML IJ SOLN
40.0000 mg | Freq: Two times a day (BID) | INTRAMUSCULAR | Status: DC
  Administered 2016-01-31 – 2016-02-01 (×2): 40 mg via INTRAVENOUS
  Filled 2016-01-31 (×2): qty 4

## 2016-01-31 MED ORDER — PREGABALIN 75 MG PO CAPS
75.0000 mg | ORAL_CAPSULE | Freq: Three times a day (TID) | ORAL | Status: DC
  Administered 2016-01-31 – 2016-02-01 (×3): 75 mg via ORAL
  Filled 2016-01-31 (×3): qty 1

## 2016-01-31 MED ORDER — HEPARIN SOD (PORK) LOCK FLUSH 100 UNIT/ML IV SOLN
500.0000 [IU] | INTRAVENOUS | Status: AC | PRN
  Administered 2016-01-31: 500 [IU]
  Filled 2016-01-31: qty 5

## 2016-01-31 MED ORDER — PREGABALIN 75 MG PO CAPS: 75.0000 mg | ORAL_CAPSULE | Freq: Three times a day (TID) | ORAL | 0 refills | Status: DC

## 2016-01-31 MED ORDER — BISACODYL 5 MG PO TBEC
10.0000 mg | DELAYED_RELEASE_TABLET | Freq: Once | ORAL | Status: AC
  Administered 2016-01-31: 10 mg via ORAL
  Filled 2016-01-31: qty 2

## 2016-01-31 MED ORDER — DICLOFENAC SODIUM 1 % TD GEL
2.0000 g | Freq: Four times a day (QID) | TRANSDERMAL | Status: DC
  Administered 2016-01-31 – 2016-02-01 (×6): 2 g via TOPICAL
  Filled 2016-01-31: qty 100

## 2016-01-31 MED ORDER — CIPROFLOXACIN HCL 500 MG PO TABS: 500.0000 mg | ORAL_TABLET | Freq: Two times a day (BID) | ORAL | 0 refills | Status: DC

## 2016-01-31 MED ORDER — DICLOFENAC SODIUM 1 % TD GEL: 2.0000 g | Freq: Four times a day (QID) | TRANSDERMAL | 0 refills | Status: AC

## 2016-01-31 MED ORDER — POLYETHYLENE GLYCOL 3350 17 G PO PACK: 17.0000 g | PACK | Freq: Two times a day (BID) | ORAL | 0 refills | Status: DC | PRN

## 2016-01-31 MED ORDER — PREGABALIN 150 MG PO CAPS: 150.0000 mg | ORAL_CAPSULE | Freq: Three times a day (TID) | ORAL | 0 refills | Status: AC

## 2016-01-31 MED ORDER — FUROSEMIDE 10 MG/ML IJ SOLN
20.0000 mg | Freq: Once | INTRAMUSCULAR | Status: AC
  Administered 2016-01-31: 20 mg via INTRAVENOUS
  Filled 2016-01-31: qty 2

## 2016-01-31 NOTE — Progress Notes (Signed)
Discharge instructions rendered, discussed home meds,appointments, patient verbalized understanding. All questions answered appropriately. Porta cath flushed and deacessed, site unremarkable.

## 2016-01-31 NOTE — Progress Notes (Signed)
Porta cath accessed,patient tolerated the procedure. Patient's d/c cancelled. Will administer IV Lasix per order. Denies SOB, O2 sat 100 on RA.

## 2016-01-31 NOTE — Progress Notes (Signed)
[facility]  Discharge cancelled because patient states he his abdomen and feet have enlarged due to fluid and he is unable to buckle his pants or put on shoes. Weight noted to have increased by 15 kg since admission. No noted pitting edema on exam.  Will increase Lasix (make it IV Q12) and follow for improvement.   Debbe Odea, MD

## 2016-01-31 NOTE — Progress Notes (Signed)
Pt requesting RW for home. [employer] rep alerted of order. Marney Doctor RN,BSN,NCM [phone]

## 2016-01-31 NOTE — Discharge Summary (Addendum)
Physician Discharge Summary  WYLER SOUTHWOOD [id number] DOB: 07-21-1960 DOA: 01/25/2016  PCP: Pcp Not In System- at [place] date: 01/25/2016 Discharge date: 02/02/2016  Admitted From: home  Disposition:  home   Recommendations for Outpatient Follow-up:  1. Bmet, CBC, anemia panel in 1 wk by PCP 2. Assess for further need for Lasix (ascites?)   Discharge Condition:  stable   CODE STATUS:  Full code   Diet recommendation:  Heart healthy, low sodium Consultations:  GI  Gen surgery    Discharge Diagnoses:  Principal Problem:   Bleeding esophageal varices (HCC) Active Problems:   Acute blood loss anemia   Left inguinal hernia   Thrombocytopenia (HCC)   Metastatic colon cancer to liver [facility])   Peripheral neuropathy due to chemotherapy (HCC)   Fluid overload, unspecified    Subjective: Pain is left groin is easing up. It is a burning pain which occurs mainly when he walks. No other complaints.   Brief Summary: James Marberry Maxeyis a 55 y.o.malewith PMH as outlined below including recently diagnosed Stage IV colon CA with mets to liver and lung, currently undergoing chemotherapy (Avastin/Xeloda under the care of Dr. Nelda Marseille at [facility], presents with nausea, and generalized weakness, and 2 episodes of hematemesis. He was seen by Dr. Amedeo Plenty of GI and had emergent EGD performed which demonstrated 4 columns of recently bleeding grade III mid - distal esophageal varices along with clotted blood in the stomach.He had 3 bands placed with incomplete eradication of the varices and slow ooze remained after the procedure. He was initially admitted BY PCCM service and transferred to [facility] service on 10/29.   Hospital Course:   Esophageal variceal bleed - s/p banding on 10/30- will need further banding in about 2- 3 wks by Dr Amedeo Plenty - tolerating soft diet - recommended to be discharged on PPI QD and 10 days total of antibiotics for SBP prevention- currently on Cipro - Atenolol  resumed  Anemia due to acute blood loss - transfused a total of 3 u PRBC -baseline Hb 10- nadir 5.9- now in 8-9 range- see trend below  Left inguinal hernia/ groin pain - pain initially occurred when getting out of bed about 2 days ago- ? sprain- add Voltaren gel -he states he was told by Gen surgery that the hernia may be hitting a nerve when he ambulates- pain is buringin in nature and, as mentioned worse with walking-  increase Lyrica from75 BID to 75 TID- he already has a prescription for Oxycodone at home - hernia is easily reducible- gen surgery recommending elective repair as outpt  Fluid overloaded - due to IVF infusion in the hospital - have diuresed aggressively with IV lasix - will continue oral Lasix with K replacement for 5 more days  Hypotension - hold Altace and Amlodipine- cont Atenolol  Metastatic colon cancer with liver and lung (CT 9/17) mets - cont chemo per oncologist, Dr Nelda Marseille and [facility]  Acute thrombocytopenia - normal platelets at baseline - now in 80-90 range- nadir was 65 on 10/28 - suspect due to acute blood loss - recheck in 1-2 wks  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- symptoms controlled on Lyrica    Cholelithiasis - noted on Ultrasound below  Discharge Instructions  Discharge Instructions    Diet - low sodium heart healthy    Complete by:  As directed    Discharge instructions    Complete by:  As directed    Weigh yourself daily-  you can stop the Lasix and potassium early if your weight reaches your usual weight   Increase activity slowly    Complete by:  As directed        Medication List    STOP taking these medications   amLODipine 10 MG tablet Commonly known as:  NORVASC   ramipril 5 MG capsule Commonly known as:  ALTACE     TAKE these medications   ALPRAZolam 0.5 MG tablet Commonly known as:  XANAX Take 0.5 mg by mouth at bedtime as needed for anxiety.   aspirin 81 MG chewable tablet Chew 81  mg by mouth daily.   atenolol 100 MG tablet Commonly known as:  TENORMIN Take 50 mg by mouth daily.   atorvastatin 80 MG tablet Commonly known as:  LIPITOR Take 80 mg by mouth daily.   ciprofloxacin 500 MG tablet Commonly known as:  CIPRO Take 1 tablet (500 mg total) by mouth 2 (two) times daily. One po bid x 7 days   diclofenac sodium 1 % Gel Commonly known as:  VOLTAREN Apply 2 g topically 4 (four) times daily. To left groin   furosemide 40 MG tablet Commonly known as:  LASIX Take 1 tablet (40 mg total) by mouth 2 (two) times daily.   ketotifen 0.025 % ophthalmic solution Commonly known as:  ZADITOR Place 1 drop into both eyes 2 (two) times daily.   nitroGLYCERIN 0.4 MG SL tablet Commonly known as:  NITROSTAT Place 0.4 mg under the tongue every 5 (five) minutes as needed for chest pain.   omeprazole 40 MG capsule Commonly known as:  PRILOSEC Take 40 mg by mouth daily.   ondansetron 8 MG disintegrating tablet Commonly known as:  ZOFRAN-ODT Take 8 mg by mouth every 8 (eight) hours as needed for nausea or vomiting.   oxyCODONE 5 MG immediate release tablet Commonly known as:  Oxy IR/ROXICODONE Take 10 mg by mouth every 4 (four) hours as needed for severe pain.   Potassium Chloride ER 20 MEQ Tbcr Take 20 mEq by mouth daily.   pregabalin 150 MG capsule Commonly known as:  LYRICA Take 1 capsule (150 mg total) by mouth 3 (three) times daily. What changed:  when to take this  Another medication with the same name was removed. Continue taking this medication, and follow the directions you see here.   zolpidem 10 MG tablet Commonly known as:  AMBIEN Take 10 mg by mouth at bedtime as needed for sleep.      Follow-up Information    HAYES,JOHN C, MD. Schedule an appointment as soon as possible for a visit in 1 week(s).   Specialty:  Gastroenterology Why:  Their office should call you for an appt. please call them in 3-4 days if you have not received a call Contact  information: [address] [place] [address] [phone]        Odis Hollingshead, MD. Call today.   Specialty:  General Surgery Why:  as needed for Inguinal hernia Contact information: [address] [place] [address] [phone]          Allergies  Allergen Reactions  . Tylenol [Acetaminophen]     Chills     Procedures/Studies: EGD 10/27  US Pelvis Limited  Result Date: 01/29/2016 CLINICAL DATA:  Evaluate for left groin pain for the past days, possible inguinal hernia. EXAM: LIMITED ULTRASOUND OF PELVIS TECHNIQUE: Limited transabdominal ultrasound examination of the pelvis was performed. COMPARISON:  Abdominal and  pelvic CT scan dated April 28, 2013 and abdominal ultrasound of January 27, 2016 FINDINGS: Ascites is present in the left lower quadrant and is centered over the area of pain. In the left groin no definite hernia is observed. IMPRESSION: There is ascites in the left lower quadrant of the abdomen. No inguinal hernia is observed. If further imaging is felt indicated clinically, abdominal and pelvic CT scanning would be the most useful next imaging step. Electronically Signed   By: David  Martinique M.D.   On: 01/29/2016 12:20   Dg Chest Port 1 View  Result Date: 01/27/2016 CLINICAL DATA:  Respiratory failure. EXAM: PORTABLE CHEST 1 VIEW COMPARISON:  01/26/2016 FINDINGS: Right-sided Port-A-Cath in satisfactory position. Left IJ central venous catheter with the tip projecting over the SVC. Low lung volumes. No focal consolidation, pleural effusion or pneumothorax. Stable cardiomediastinal silhouette. No acute osseous abnormality. IMPRESSION: No active disease. Electronically Signed   By: Kathreen Devoid   On: 01/27/2016 08:56   Dg Chest Portable 1 View  Result Date: 01/26/2016 CLINICAL DATA:  Central line placement EXAM: PORTABLE CHEST 1 VIEW COMPARISON:  01/25/2016 FINDINGS: Supine portable view chest demonstrates low lung volumes. A  right-sided central venous port tip overlies the low right atrium. Interval insertion of left-sided jugular venous catheter with tip superimposing the SVC. Mild subsegmental atelectasis at the left base. Stable cardiomediastinal contour. No pneumothorax. IMPRESSION: 1. New left-sided central venous catheter tip overlies the SVC. No pneumothorax. 2. Markedly low lung volumes with left subsegmental basilar atelectasis. Electronically Signed   By: Donavan Foil M.D.   On: 01/26/2016 02:06   Dg Chest Port 1 View  Result Date: 01/25/2016 CLINICAL DATA:  Anterior chest pain and vomiting. EXAM: PORTABLE CHEST 1 VIEW COMPARISON:  Chest radiograph 05/28/2008 FINDINGS: There is a dual-lumen right chest wall Port-A-Cath with tip overlying the proximal right atrium. There is shallow lung inflation without focal consolidation or pulmonary edema. No pneumothorax or pleural effusion. Cardiomediastinal contours are normal. IMPRESSION: Clear lungs. Electronically Signed   By: Ulyses Jarred M.D.   On: 01/25/2016 22:45   US Abdomen Limited Ruq  Result Date: 01/28/2016 CLINICAL DATA:  Assess for ascites. EXAM: US ABDOMEN LIMITED - RIGHT UPPER QUADRANT COMPARISON:  None. FINDINGS: Gallbladder: The gallbladder is mildly distended. The gallbladder wall measures 3.1 mm which is borderline. An 8 mm stone is identified with no Murphy's sign or pericholecystic fluid. Common bile duct: Diameter: 5.3 mm Liver: The liver is heterogeneous. By report, the patient has liver metastases but no focal masses are seen on this study. There is an apparent mildly nodular contour to the liver. There is adjacent ascites. IMPRESSION: 1. The liver is heterogeneous with suggestion of a nodular contour. There is adjacent ascites. Despite the history of metastatic disease, no discrete masses were imaged by the sonographer. 2. The gallbladder is mildly distended and the gallbladder wall measures 3.1 mm which is borderline. A single 8 mm stone is seen  with no Murphy's sign. Electronically Signed   By: Dorise Bullion III M.D   On: 01/28/2016 01:51       Discharge Exam: Vitals:   02/01/16 2030 02/02/16 0445  BP: 128/68 111/71  Pulse: (!) 103 (!) 101  Resp: 20 20  Temp: 98.9 F (37.2 C) 98.2 F (36.8 C)   Vitals:   02/01/16 1500 02/01/16 2030 02/02/16 0445 02/02/16 0704  BP:  128/68 111/71   Pulse:  (!) 103 (!) 101   Resp:  20 20   Temp:  98.9 F (37.2 C) 98.2 F (36.8 C)   TempSrc:  Oral Oral   SpO2:  98% 97%   Weight: 94.8 kg (209 lb)   93.9 kg (207 lb)  Height:        General: Pt is alert, awake, not in acute distress Cardiovascular: RRR, S1/S2 +, no rubs, no gallops Respiratory: CTA bilaterally, no wheezing, no rhonchi Abdominal: Soft, mild tenderness in L groin, ND, bowel sounds + Extremities: no edema, no cyanosis    The results of significant diagnostics from this hospitalization (including imaging, microbiology, ancillary and laboratory) are listed below for reference.     Microbiology: Recent Results (from the past 240 hour(s))  MRSA PCR Screening     Status: None   Collection Time: 01/26/16  2:14 AM  Result Value Ref Range Status   MRSA by PCR NEGATIVE NEGATIVE Final    Comment:        The GeneXpert MRSA Assay (FDA approved for NASAL specimens only), is one component of a comprehensive MRSA colonization surveillance program. It is not intended to diagnose MRSA infection nor to guide or monitor treatment for MRSA infections.      Labs: BNP (last 3 results) No results for input(s): BNP in the last 8760 hours. Basic Metabolic Panel:  Recent Labs Lab 01/27/16 0859 01/28/16 0500 01/29/16 0542 02/01/16 1338 02/02/16 0550  NA 137 137 137 132* 136  K 3.8 3.4* 3.9 3.7 3.6  CL 113* 113* 113* 104 101  CO2 20* 21* 21* 25 28  GLUCOSE 154* 136* 133* 205* 122*  BUN 16 13 10 11 10   CREATININE 0.71 0.78 0.68 0.83 0.73  CALCIUM 7.3* 7.3* 7.4* 7.8* 7.9*  MG  --  1.9  --   --   --   PHOS  --   2.1* 2.6  --   --    Liver Function Tests: No results for input(s): AST, ALT, ALKPHOS, BILITOT, PROT, ALBUMIN in the last 168 hours. No results for input(s): LIPASE, AMYLASE in the last 168 hours. No results for input(s): AMMONIA in the last 168 hours. CBC:  Recent Labs Lab 01/28/16 0500 01/29/16 0542 01/30/16 1630 02/01/16 1338 02/02/16 0550  WBC 4.9 6.4 5.3 5.0 3.8*  HGB 7.2* 8.9* 9.2* 9.1* 8.8*  HCT 22.4* 27.3* 27.7* 27.9* 26.9*  MCV 83.9 84.8 83.7 82.5 83.3  PLT 72* 80* 81* 73* 66*   Cardiac Enzymes:  Recent Labs Lab 01/26/16 1445  TROPONINI <0.03   BNP: Invalid input(s): POCBNP CBG:  Recent Labs Lab 02/01/16 1623 02/01/16 2022 02/02/16 0003 02/02/16 0435 02/02/16 0800  GLUCAP 234* 230* 163* 119* 172*   D-Dimer No results for input(s): DDIMER in the last 72 hours. Hgb A1c No results for input(s): HGBA1C in the last 72 hours. Lipid Profile No results for input(s): CHOL, HDL, LDLCALC, TRIG, CHOLHDL, LDLDIRECT in the last 72 hours. Thyroid function studies No results for input(s): TSH, T4TOTAL, T3FREE, THYROIDAB in the last 72 hours.  Invalid input(s): FREET3 Anemia work up No results for input(s): VITAMINB12, FOLATE, FERRITIN, TIBC, IRON, RETICCTPCT in the last 72 hours. Urinalysis    Component Value Date/Time   COLORURINE AMBER (A) 01/26/2016 0427   APPEARANCEUR CLEAR 01/26/2016 0427   LABSPEC 1.024 01/26/2016 0427   PHURINE 6.0 01/26/2016 0427   GLUCOSEU 250 (A) 01/26/2016 0427   HGBUR NEGATIVE 01/26/2016 0427   BILIRUBINUR NEGATIVE 01/26/2016 [place] 01/26/2016 0427   PROTEINUR NEGATIVE 01/26/2016 0427   UROBILINOGEN 0.2 04/28/2013 0555  NITRITE NEGATIVE 01/26/2016 0427   LEUKOCYTESUR NEGATIVE 01/26/2016 0427   Sepsis Labs Invalid input(s): PROCALCITONIN,  WBC,  LACTICIDVEN Microbiology Recent Results (from the past 240 hour(s))  MRSA PCR Screening     Status: None   Collection Time: 01/26/16  2:14 AM  Result Value Ref  Range Status   MRSA by PCR NEGATIVE NEGATIVE Final    Comment:        The GeneXpert MRSA Assay (FDA approved for NASAL specimens only), is one component of a comprehensive MRSA colonization surveillance program. It is not intended to diagnose MRSA infection nor to guide or monitor treatment for MRSA infections.      Time coordinating discharge: Over 30 minutes  SIGNED:   Debbe Odea, MD  [facility] 02/02/2016, 10:16 AM Pager   If 7PM-7AM, please contact night-coverage [email or web address] Password [email or web address]

## 2016-01-31 NOTE — Progress Notes (Signed)
PT Cancellation Note  Patient Details Name: YEABSIRA CASELLI MRN: [id number] DOB: 1960-12-27   Cancelled Treatment:    Reason Eval/Treat Not Completed: Pain limiting ability to participate (states his hernia is painful but easing. Plans to walk this PM. will check back another time. Reports ambulating in room withou problems.)   Claretha Cooper 01/31/2016, 9:08 AM Tresa Endo PT [phone]

## 2016-02-01 LAB — GLUCOSE, CAPILLARY
GLUCOSE-CAPILLARY: 117 mg/dL — AB (ref 65–99)
GLUCOSE-CAPILLARY: 230 mg/dL — AB (ref 65–99)
Glucose-Capillary: 146 mg/dL — ABNORMAL HIGH (ref 65–99)
Glucose-Capillary: 190 mg/dL — ABNORMAL HIGH (ref 65–99)
Glucose-Capillary: 191 mg/dL — ABNORMAL HIGH (ref 65–99)
Glucose-Capillary: 234 mg/dL — ABNORMAL HIGH (ref 65–99)

## 2016-02-01 LAB — BASIC METABOLIC PANEL
Anion gap: 3 — ABNORMAL LOW (ref 5–15)
BUN: 11 mg/dL (ref 6–20)
CHLORIDE: 104 mmol/L (ref 101–111)
CO2: 25 mmol/L (ref 22–32)
Calcium: 7.8 mg/dL — ABNORMAL LOW (ref 8.9–10.3)
Creatinine, Ser: 0.83 mg/dL (ref 0.61–1.24)
GFR calc non Af Amer: >60 mL/min (ref >60)
Glucose, Bld: 205 mg/dL — ABNORMAL HIGH (ref 65–99)
POTASSIUM: 3.7 mmol/L (ref 3.5–5.1)
SODIUM: 132 mmol/L — AB (ref 135–145)

## 2016-02-01 LAB — CBC
HEMATOCRIT: 27.9 % — AB (ref 39.0–52.0)
HEMOGLOBIN: 9.1 g/dL — AB (ref 13.0–17.0)
MCH: 26.9 pg (ref 26.0–34.0)
MCHC: 32.6 g/dL (ref 30.0–36.0)
MCV: 82.5 fL (ref 78.0–100.0)
Platelets: 73 10*3/uL — ABNORMAL LOW (ref 150–400)
RBC: 3.38 MIL/uL — AB (ref 4.22–5.81)
RDW: 18.4 % — ABNORMAL HIGH (ref 11.5–15.5)
WBC: 5 10*3/uL (ref 4.0–10.5)

## 2016-02-01 MED ORDER — PREGABALIN 75 MG PO CAPS
150.0000 mg | ORAL_CAPSULE | Freq: Three times a day (TID) | ORAL | Status: DC
  Administered 2016-02-01 – 2016-02-02 (×3): 150 mg via ORAL
  Filled 2016-02-01 (×3): qty 2

## 2016-02-01 MED ORDER — FUROSEMIDE 10 MG/ML IJ SOLN
40.0000 mg | Freq: Three times a day (TID) | INTRAMUSCULAR | Status: DC
  Administered 2016-02-01 – 2016-02-02 (×3): 40 mg via INTRAVENOUS
  Filled 2016-02-01 (×4): qty 4

## 2016-02-01 NOTE — Progress Notes (Signed)
PROGRESS NOTE    MILLAGE PACE  [id number] DOB: 09/05/1960 DOA: 01/25/2016  PCP: Pcp Not In System   Brief Narrative:  James Russell a 54 y.o.malewith PMH as outlined below including recently diagnosed Stage IV colon CA with mets to liver and lung, currently undergoing chemotherapy (Avastin/Xeloda under the care of Dr. Nelda Marseille at [facility], presents with nausea, and generalized weakness, and 2 episodes of hematemesis. He was seen by Dr. Amedeo Plenty of GI and had emergent EGD performed which demonstrated 4 columns of recently bleeding grade III mid - distal esophageal varices along with clotted blood in the stomach.He had 3 bands placed with incomplete eradication of the varices and slow ooze remained after the procedure. He was initially admitted by [facility] service and transferred to [facility] service on 10/29.   Subjective: Swelling in abdomen improving but not back to normal. Pain in left groin improving as well.   Assessment & Plan:  Esophageal variceal bleed - s/p banding on 10/30- will need further banding in about 2- 3 wks by Dr Amedeo Plenty - tolerating soft diet - recommended to be discharged on PPI QD and 10 days total of antibiotics for SBP prevention- currently on Cipro  Anemia due to acute blood loss - transfused a total of 3 u PRBC -baseline Hb 10- nadir 5.9- 9.2 today- see trend below  Left inguinal hernia/ groin pain - pain initially occurred when getting out of bed about 2 days ago- ? sprain- add Voltaren gel - hernia is easily reducible- gen surgery recommending elective repair as outpt -he states he was told by Gen surgery that the hernia may be hitting a nerve when he ambulates- pain is burining in nature and, as mentioned worse with walking-  increase Lyrica from 150 BID to 150 TID- he already has a prescription for Oxycodone at home  Fluid overloaded - no pulm edema but has swelling of abdomen and legs - cont IV lasix   Metastatic colon cancer with liver and lung (CT 9/17)  mets - cont chemo per oncologist, Dr Nelda Marseille and [facility]  Acute thrombocytopenia - normal platelets at baseline - now in 80-90 range- nadir was 65 on 10/28 - suspect due to acute blood loss - recheck in 1-2 wks  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                            Incompletely eradicated. Banded.                           - Clotted blood in the stomach.                           - A large amount of food (residue) in the stomach.                           - Normal.                           - No specimens collected. Antimicrobials:  Anti-infectives  Start     Dose/Rate Route Frequency Ordered Stop   01/31/16 0000  ciprofloxacin (CIPRO) 500 MG tablet     500 mg Oral 2 times daily 01/31/16 1043     01/28/16 2000  ciprofloxacin (CIPRO) tablet 500 mg     500 mg Oral 2 times daily 01/28/16 1601     01/27/16 1000  cefTRIAXone (ROCEPHIN) 2 g in dextrose 5 % 50 mL IVPB  Status:  Discontinued     2 g 100 mL/hr over 30 Minutes Intravenous Every 24 hours 01/27/16 0923 01/28/16 1601   01/26/16 1000  cefTRIAXone (ROCEPHIN) 1 g in dextrose 5 % 50 mL IVPB  Status:  Discontinued     1 g 100 mL/hr over 30 Minutes Intravenous Every 24 hours 01/26/16 0905 01/27/16 0923       Objective: Vitals:   01/31/16 1523 01/31/16 2056 02/01/16 0500 02/01/16 0947  BP: 119/65 131/71 120/74 103/80  Pulse: (!) 113 (!) 107 94   Resp: 18 20 20    Temp: 98.5 F (36.9 C) 99.8 F (37.7 C) 98.7 F (37.1 C)   TempSrc: Oral Oral Oral   SpO2: 99% 99% 99%   Weight:      Height:        Intake/Output Summary (Last 24 hours) at 02/01/16 1334 Last data filed at 02/01/16 0942  Gross  per 24 hour  Intake              650 ml  Output             2675 ml  Net            -2025 ml   Filed Weights   01/29/16 0500 01/29/16 1626 01/31/16 1342  Weight: 97 kg (213 lb 13.5 oz) 98.9 kg (218 lb) 97.3 kg (214 lb 6.4 oz)    Examination: General exam: Appears comfortable  HEENT: PERRLA, oral mucosa moist, no sclera icterus or thrush Respiratory system: Clear to auscultation. Respiratory effort normal. Cardiovascular system: S1 & S2 heard, RRR.  No murmurs  Gastrointestinal system: Abdomen soft, mildly tender in left groin, nondistended. Normal bowel sound. No organomegaly Central nervous system: Alert and oriented. No focal neurological deficits. Extremities: No cyanosis, clubbing or edema Skin: No rashes or ulcers Psychiatry:  Mood & affect appropriate.     Data Reviewed: I have personally reviewed following labs and imaging studies  CBC:  Recent Labs Lab 01/25/16 2134  01/26/16 1840 01/27/16 0859 01/28/16 0500 01/29/16 0542 01/30/16 1630  WBC 10.9*  --   --  5.3 4.9 6.4 5.3  HGB 7.4*  < > 8.1* 7.6* 7.2* 8.9* 9.2*  HCT 23.1*  < > 24.5* 23.2* 22.4* 27.3* 27.7*  MCV 82.2  --   --  83.5 83.9 84.8 83.7  PLT 131*  --   --  65* 72* 80* 81*  < > = values in this interval not displayed. Basic Metabolic Panel:  Recent Labs Lab 01/26/16 0335 01/26/16 1445 01/27/16 0859 01/28/16 0500 01/29/16 0542  NA 136 136 137 137 137  K 5.2* 4.3 3.8 3.4* 3.9  CL 114* 114* 113* 113* 113*  CO2 19* 20* 20* 21* 21*  GLUCOSE 180* 158* 154* 136* 133*  BUN 15 17 16 13 10   CREATININE 0.76 0.77 0.71 0.78 0.68  CALCIUM 7.2* 7.0* 7.3* 7.3* 7.4*  MG 1.7  --   --  1.9  --   PHOS 2.7  --   --  2.1* 2.6   GFR: Estimated  Creatinine Clearance: 121.2 mL/min (by C-G formula based on SCr of 0.68 mg/dL). Liver Function Tests:  Recent Labs Lab 01/25/16 2134  AST 37  ALT 32  ALKPHOS 111  BILITOT 1.2  PROT 4.8*  ALBUMIN 2.5*    Recent Labs Lab 01/25/16 2134  LIPASE 77*   No  results for input(s): AMMONIA in the last 168 hours. Coagulation Profile:  Recent Labs Lab 01/25/16 2135  INR 1.32   Cardiac Enzymes:  Recent Labs Lab 01/26/16 0335 01/26/16 1445  TROPONINI <0.03 <0.03   BNP (last 3 results) No results for input(s): PROBNP in the last 8760 hours. HbA1C: No results for input(s): HGBA1C in the last 72 hours. CBG:  Recent Labs Lab 01/31/16 2052 02/01/16 0025 02/01/16 0411 02/01/16 0759 02/01/16 1202  GLUCAP 199* 190* 117* 146* 191*   Lipid Profile: No results for input(s): CHOL, HDL, LDLCALC, TRIG, CHOLHDL, LDLDIRECT in the last 72 hours. Thyroid Function Tests: No results for input(s): TSH, T4TOTAL, FREET4, T3FREE, THYROIDAB in the last 72 hours. Anemia Panel: No results for input(s): VITAMINB12, FOLATE, FERRITIN, TIBC, IRON, RETICCTPCT in the last 72 hours. Urine analysis:    Component Value Date/Time   COLORURINE AMBER (A) 01/26/2016 0427   APPEARANCEUR CLEAR 01/26/2016 0427   LABSPEC 1.024 01/26/2016 0427   PHURINE 6.0 01/26/2016 0427   GLUCOSEU 250 (A) 01/26/2016 0427   HGBUR NEGATIVE 01/26/2016 0427   BILIRUBINUR NEGATIVE 01/26/2016 0427   KETONESUR NEGATIVE 01/26/2016 0427   PROTEINUR NEGATIVE 01/26/2016 0427   UROBILINOGEN 0.2 04/28/2013 0555   NITRITE NEGATIVE 01/26/2016 0427   LEUKOCYTESUR NEGATIVE 01/26/2016 0427   Sepsis Labs: @LABRCNTIP (procalcitonin:4,lacticidven:4) ) Recent Results (from the past 240 hour(s))  MRSA PCR Screening     Status: None   Collection Time: 01/26/16  2:14 AM  Result Value Ref Range Status   MRSA by PCR NEGATIVE NEGATIVE Final    Comment:        The GeneXpert MRSA Assay (FDA approved for NASAL specimens only), is one component of a comprehensive MRSA colonization surveillance program. It is not intended to diagnose MRSA infection nor to guide or monitor treatment for MRSA infections.          Radiology Studies: No results found.    Scheduled Meds: . amLODipine  10  mg Oral Daily  . ciprofloxacin  500 mg Oral BID  . diclofenac sodium  2 g Topical QID  . furosemide  40 mg Intravenous TID  . insulin aspart  0-15 Units Subcutaneous Q4H  . ketotifen  1 drop Both Eyes BID  . mouth rinse  15 mL Mouth Rinse BID  . pantoprazole  40 mg Oral Daily  . polyethylene glycol  17 g Oral BID  . pregabalin  150 mg Oral TID  . ramipril  10 mg Oral BID   Continuous Infusions:    LOS: 6 days    Time spent in minutes: 54    [place], MD [facility] Pager: [email or web address] Password [email or web address] 02/01/2016, 1:34 PM

## 2016-02-01 NOTE — Evaluation (Signed)
Physical Therapy Evaluation Patient Details Name: James Russell MRN: [id number] DOB: Nov 05, 1960 Today's Date: 02/01/2016   History of Present Illness  55 y.o. male with PMH as outlined below including recently diagnosed Stage IV colon CA with mets to liver and lung, currently undergoing chemotherapy (Avastin/Xeloda under the care of Dr. Nelda Marseille at [facility], presents with nausea, and generalized weakness, and 2 episodes of hematemesis; admitted for esophageal variceal bleed  Clinical Impression  Patient evaluated by Physical Therapy with no further acute PT needs identified. All education has been completed and the patient has no further questions.   Pt ambulating well with RW and discussed safe stair technique due to L groin pain.  Pt reports understanding and feels ready for d/c home.  See below for any follow-up Physical Therapy or equipment needs. PT is signing off. Thank you for this referral.     Follow Up Recommendations No PT follow up    Equipment Recommendations  None recommended by PT (RW already delivered to room)    Recommendations for Other Services       Precautions / Restrictions Precautions Precautions: Fall      Mobility  Bed Mobility Overal bed mobility: Modified Independent                Transfers Overall transfer level: Modified independent                  Ambulation/Gait Ambulation/Gait assistance: Supervision;Modified independent (Device/Increase time) Ambulation Distance (Feet): 400 Feet Assistive device: Rolling walker (2 wheeled) Gait Pattern/deviations: Step-through pattern     General Gait Details: steady with RW, pt using for more support and pain control if L groin becomes painful however reports no increased pain  Stairs            Wheelchair Mobility    Modified Rankin (Stroke Patients Only)       Balance                                             Pertinent Vitals/Pain Pain Assessment:  0-10 Pain Score:  (not rated but reports better today) Pain Location: L groin - inguinal hernia Pain Intervention(s): Limited activity within patient's tolerance;Monitored during session    Home Living Family/patient expects to be discharged to:: Private residence Living Arrangements: Spouse/significant other   Type of Home: House       Home Layout: Two level Home Equipment: None      Prior Function Level of Independence: Independent               Hand Dominance        Extremity/Trunk Assessment               Lower Extremity Assessment: Overall WFL for tasks assessed;LLE deficits/detail   LLE Deficits / Details: L inguinal hernia with new groin pain, able to perform hip flexion without pain, states only hip extension now provokes pain, overall WFL during mobility     Communication   Communication: No difficulties  Cognition Arousal/Alertness: Awake/alert Behavior During Therapy: WFL for tasks assessed/performed Overall Cognitive Status: Within Functional Limits for tasks assessed                      General Comments      Exercises     Assessment/Plan    PT Assessment Patent does not need any further  PT services  PT Problem List            PT Treatment Interventions      PT Goals (Current goals can be found in the Care Plan section)  Acute Rehab PT Goals PT Goal Formulation: All assessment and education complete, DC therapy    Frequency     Barriers to discharge        Co-evaluation               End of Session   Activity Tolerance: Patient tolerated treatment well Patient left: with call bell/phone within reach;with family/visitor present Nurse Communication: Mobility status         Time: [id number] PT Time Calculation (min) (ACUTE ONLY): 10 min   Charges:   PT Evaluation $PT Eval Low Complexity: 1 Procedure     PT G Codes:        Tiffanye Hartmann,KATHrine E 02/01/2016, 2:58 PM Carmelia Bake, PT,  DPT 02/01/2016 Pager: [phone]

## 2016-02-01 NOTE — Progress Notes (Signed)
PT Cancellation Note  Patient Details Name: James Russell MRN: [id number] DOB: 1960-06-03   Cancelled Treatment:    Reason Eval/Treat Not Completed: Fatigue/lethargy limiting ability to participate Pt sleeping on arrival.  Requests PT to check back, agreeable after lunch.   Kendi Defalco,KATHrine E 02/01/2016, 11:01 AM Carmelia Bake, PT, DPT 02/01/2016 Pager: [phone]

## 2016-02-02 DIAGNOSIS — E8770 Fluid overload, unspecified: Secondary | ICD-10-CM

## 2016-02-02 LAB — BASIC METABOLIC PANEL
Anion gap: 7 (ref 5–15)
BUN: 10 mg/dL (ref 6–20)
CALCIUM: 7.9 mg/dL — AB (ref 8.9–10.3)
CO2: 28 mmol/L (ref 22–32)
CREATININE: 0.73 mg/dL (ref 0.61–1.24)
Chloride: 101 mmol/L (ref 101–111)
GFR calc Af Amer: >60 mL/min (ref >60)
GFR calc non Af Amer: >60 mL/min (ref >60)
GLUCOSE: 122 mg/dL — AB (ref 65–99)
Potassium: 3.6 mmol/L (ref 3.5–5.1)
Sodium: 136 mmol/L (ref 135–145)

## 2016-02-02 LAB — GLUCOSE, CAPILLARY
GLUCOSE-CAPILLARY: 163 mg/dL — AB (ref 65–99)
GLUCOSE-CAPILLARY: 172 mg/dL — AB (ref 65–99)
Glucose-Capillary: 119 mg/dL — ABNORMAL HIGH (ref 65–99)

## 2016-02-02 LAB — CBC
HCT: 26.9 % — ABNORMAL LOW (ref 39.0–52.0)
HEMOGLOBIN: 8.8 g/dL — AB (ref 13.0–17.0)
MCH: 27.2 pg (ref 26.0–34.0)
MCHC: 32.7 g/dL (ref 30.0–36.0)
MCV: 83.3 fL (ref 78.0–100.0)
Platelets: 66 10*3/uL — ABNORMAL LOW (ref 150–400)
RBC: 3.23 MIL/uL — ABNORMAL LOW (ref 4.22–5.81)
RDW: 18.4 % — ABNORMAL HIGH (ref 11.5–15.5)
WBC: 3.8 10*3/uL — ABNORMAL LOW (ref 4.0–10.5)

## 2016-02-02 MED ORDER — FUROSEMIDE 40 MG PO TABS: 40.0000 mg | ORAL_TABLET | Freq: Two times a day (BID) | ORAL | 0 refills | Status: AC

## 2016-02-02 MED ORDER — POTASSIUM CHLORIDE ER 20 MEQ PO TBCR: 20.0000 meq | EXTENDED_RELEASE_TABLET | Freq: Every day | ORAL | 0 refills | Status: DC

## 2016-02-02 MED ORDER — HEPARIN SOD (PORK) LOCK FLUSH 100 UNIT/ML IV SOLN
500.0000 [IU] | Freq: Once | INTRAVENOUS | Status: AC
Start: 2016-02-02 — End: 2016-02-02
  Administered 2016-02-02: 500 [IU] via INTRAVENOUS
  Filled 2016-02-02: qty 5

## 2016-02-02 MED ORDER — CIPROFLOXACIN HCL 500 MG PO TABS: 500.0000 mg | ORAL_TABLET | Freq: Two times a day (BID) | ORAL | 0 refills | Status: DC

## 2016-02-02 NOTE — Discharge Instructions (Signed)
You need to see your doctor in 1 wk and have the following blood work done: Bmet, CBC and anemia panel   Please take all your medications with you for your next visit with your Primary MD. Please request your Primary MD to go over all hospital test results at the follow up. Please ask your Primary MD to get all Hospital records sent to his/her office.  If you experience worsening of your admission symptoms, develop shortness of breath, chest pain, suicidal or homicidal thoughts or a life threatening emergency, you must seek medical attention immediately by calling 911 or calling your MD.  Dennis Bast must read the complete instructions/literature along with all the possible adverse reactions/side effects for all the medicines you take including new medications that have been prescribed to you. Take new medicines after you have completely understood and accpet all the possible adverse reactions/side effects.   Do not drive when taking pain medications or sedatives.    Do not take more than prescribed Pain, Sleep and Anxiety Medications  If you have smoked or chewed Tobacco in the last 2 yrs please stop. Stop any regular alcohol and or recreational drug use.  Wear Seat belts while driving.

## 2016-02-02 NOTE — Progress Notes (Signed)
[facility]  Patient examined and chart reviewed. Weight 98.9 kg >> 93.9 kg today.  He is stable for d/c home today with oral Furosemide and K replacement. Please see my updated d/c summary from 01/31/16.  Debbe Odea, MD

## 2016-02-20 ENCOUNTER — Inpatient Hospital Stay (HOSPITAL_COMMUNITY)
Admission: EM | Admit: 2016-02-20 | Discharge: 2016-02-26 | DRG: 432 | Disposition: A | Discharge status: Home / Self Care | Payer: PRIVATE HEALTH INSURANCE | Attending: Family Medicine | Admitting: Family Medicine

## 2016-02-20 ENCOUNTER — Encounter (HOSPITAL_COMMUNITY): Payer: Self-pay | Admitting: Emergency Medicine

## 2016-02-20 DIAGNOSIS — K766 Portal hypertension: Secondary | ICD-10-CM | POA: Diagnosis present

## 2016-02-20 DIAGNOSIS — E7800 Pure hypercholesterolemia, unspecified: Secondary | ICD-10-CM | POA: Diagnosis present

## 2016-02-20 DIAGNOSIS — I8501 Esophageal varices with bleeding: Secondary | ICD-10-CM

## 2016-02-20 DIAGNOSIS — F17200 Nicotine dependence, unspecified, uncomplicated: Secondary | ICD-10-CM | POA: Diagnosis present

## 2016-02-20 DIAGNOSIS — C189 Malignant neoplasm of colon, unspecified: Secondary | ICD-10-CM | POA: Diagnosis present

## 2016-02-20 DIAGNOSIS — K7460 Unspecified cirrhosis of liver: Secondary | ICD-10-CM | POA: Diagnosis not present

## 2016-02-20 DIAGNOSIS — K2970 Gastritis, unspecified, without bleeding: Secondary | ICD-10-CM | POA: Diagnosis present

## 2016-02-20 DIAGNOSIS — C7800 Secondary malignant neoplasm of unspecified lung: Secondary | ICD-10-CM | POA: Diagnosis present

## 2016-02-20 DIAGNOSIS — Z7982 Long term (current) use of aspirin: Secondary | ICD-10-CM

## 2016-02-20 DIAGNOSIS — C787 Secondary malignant neoplasm of liver and intrahepatic bile duct: Secondary | ICD-10-CM | POA: Diagnosis present

## 2016-02-20 DIAGNOSIS — R042 Hemoptysis: Secondary | ICD-10-CM | POA: Diagnosis not present

## 2016-02-20 DIAGNOSIS — E440 Moderate protein-calorie malnutrition: Secondary | ICD-10-CM | POA: Insufficient documentation

## 2016-02-20 DIAGNOSIS — K920 Hematemesis: Secondary | ICD-10-CM | POA: Diagnosis present

## 2016-02-20 DIAGNOSIS — K5900 Constipation, unspecified: Secondary | ICD-10-CM | POA: Diagnosis present

## 2016-02-20 DIAGNOSIS — Z79899 Other long term (current) drug therapy: Secondary | ICD-10-CM

## 2016-02-20 DIAGNOSIS — I252 Old myocardial infarction: Secondary | ICD-10-CM

## 2016-02-20 DIAGNOSIS — I8511 Secondary esophageal varices with bleeding: Secondary | ICD-10-CM | POA: Diagnosis present

## 2016-02-20 DIAGNOSIS — E119 Type 2 diabetes mellitus without complications: Secondary | ICD-10-CM | POA: Diagnosis present

## 2016-02-20 DIAGNOSIS — Z951 Presence of aortocoronary bypass graft: Secondary | ICD-10-CM

## 2016-02-20 DIAGNOSIS — Z85038 Personal history of other malignant neoplasm of large intestine: Secondary | ICD-10-CM

## 2016-02-20 DIAGNOSIS — Z6825 Body mass index (BMI) 25.0-25.9, adult: Secondary | ICD-10-CM

## 2016-02-20 DIAGNOSIS — I10 Essential (primary) hypertension: Secondary | ICD-10-CM | POA: Diagnosis present

## 2016-02-20 DIAGNOSIS — G620 Drug-induced polyneuropathy: Secondary | ICD-10-CM | POA: Diagnosis present

## 2016-02-20 DIAGNOSIS — D62 Acute posthemorrhagic anemia: Secondary | ICD-10-CM | POA: Diagnosis present

## 2016-02-20 DIAGNOSIS — T451X5A Adverse effect of antineoplastic and immunosuppressive drugs, initial encounter: Secondary | ICD-10-CM | POA: Diagnosis present

## 2016-02-20 DIAGNOSIS — D696 Thrombocytopenia, unspecified: Secondary | ICD-10-CM | POA: Diagnosis present

## 2016-02-20 DIAGNOSIS — K449 Diaphragmatic hernia without obstruction or gangrene: Secondary | ICD-10-CM | POA: Diagnosis present

## 2016-02-20 DIAGNOSIS — K3189 Other diseases of stomach and duodenum: Secondary | ICD-10-CM | POA: Diagnosis present

## 2016-02-20 NOTE — ED Triage Notes (Signed)
Brought in by EMS from home with c/o hematemesis, onset tonight.  Pt reports that he was eating when he suddenly felt nauseous and subsequently vomited "blood", approximately 200 ml.  Has hx of ruptured esesophageal varices which were "bonded" last Oct. 26.  Also has hx of colon CA that has metastasized to liver.

## 2016-02-20 NOTE — ED Notes (Signed)
Bed: WA17 Expected date:  Expected time:  Means of arrival:  Comments: 55 yr old, colon, liver cancer, hemopytsis

## 2016-02-21 ENCOUNTER — Encounter (HOSPITAL_COMMUNITY)
Admission: EM | Disposition: A | Discharge status: Home / Self Care | Payer: Self-pay | Source: Home / Self Care | Attending: Internal Medicine

## 2016-02-21 ENCOUNTER — Encounter (HOSPITAL_COMMUNITY): Payer: Self-pay | Admitting: Anesthesiology

## 2016-02-21 ENCOUNTER — Encounter (HOSPITAL_COMMUNITY): Payer: Self-pay | Admitting: Family Medicine

## 2016-02-21 DIAGNOSIS — T451X5A Adverse effect of antineoplastic and immunosuppressive drugs, initial encounter: Secondary | ICD-10-CM | POA: Diagnosis present

## 2016-02-21 DIAGNOSIS — K7460 Unspecified cirrhosis of liver: Secondary | ICD-10-CM | POA: Diagnosis present

## 2016-02-21 DIAGNOSIS — Z951 Presence of aortocoronary bypass graft: Secondary | ICD-10-CM | POA: Diagnosis not present

## 2016-02-21 DIAGNOSIS — K3189 Other diseases of stomach and duodenum: Secondary | ICD-10-CM | POA: Diagnosis present

## 2016-02-21 DIAGNOSIS — K920 Hematemesis: Secondary | ICD-10-CM | POA: Diagnosis present

## 2016-02-21 DIAGNOSIS — G620 Drug-induced polyneuropathy: Secondary | ICD-10-CM

## 2016-02-21 DIAGNOSIS — C787 Secondary malignant neoplasm of liver and intrahepatic bile duct: Secondary | ICD-10-CM

## 2016-02-21 DIAGNOSIS — C189 Malignant neoplasm of colon, unspecified: Secondary | ICD-10-CM | POA: Diagnosis not present

## 2016-02-21 DIAGNOSIS — Z6825 Body mass index (BMI) 25.0-25.9, adult: Secondary | ICD-10-CM | POA: Diagnosis not present

## 2016-02-21 DIAGNOSIS — D62 Acute posthemorrhagic anemia: Secondary | ICD-10-CM

## 2016-02-21 DIAGNOSIS — I252 Old myocardial infarction: Secondary | ICD-10-CM | POA: Diagnosis not present

## 2016-02-21 DIAGNOSIS — Z79899 Other long term (current) drug therapy: Secondary | ICD-10-CM | POA: Diagnosis not present

## 2016-02-21 DIAGNOSIS — F17200 Nicotine dependence, unspecified, uncomplicated: Secondary | ICD-10-CM | POA: Diagnosis present

## 2016-02-21 DIAGNOSIS — E119 Type 2 diabetes mellitus without complications: Secondary | ICD-10-CM | POA: Diagnosis present

## 2016-02-21 DIAGNOSIS — E7800 Pure hypercholesterolemia, unspecified: Secondary | ICD-10-CM | POA: Diagnosis present

## 2016-02-21 DIAGNOSIS — D696 Thrombocytopenia, unspecified: Secondary | ICD-10-CM | POA: Diagnosis not present

## 2016-02-21 DIAGNOSIS — K5900 Constipation, unspecified: Secondary | ICD-10-CM | POA: Diagnosis present

## 2016-02-21 DIAGNOSIS — C7800 Secondary malignant neoplasm of unspecified lung: Secondary | ICD-10-CM | POA: Diagnosis present

## 2016-02-21 DIAGNOSIS — K766 Portal hypertension: Secondary | ICD-10-CM | POA: Diagnosis present

## 2016-02-21 DIAGNOSIS — E440 Moderate protein-calorie malnutrition: Secondary | ICD-10-CM | POA: Diagnosis present

## 2016-02-21 DIAGNOSIS — K449 Diaphragmatic hernia without obstruction or gangrene: Secondary | ICD-10-CM | POA: Diagnosis present

## 2016-02-21 DIAGNOSIS — Z85038 Personal history of other malignant neoplasm of large intestine: Secondary | ICD-10-CM | POA: Diagnosis not present

## 2016-02-21 DIAGNOSIS — I10 Essential (primary) hypertension: Secondary | ICD-10-CM | POA: Diagnosis present

## 2016-02-21 DIAGNOSIS — I8511 Secondary esophageal varices with bleeding: Secondary | ICD-10-CM | POA: Diagnosis present

## 2016-02-21 DIAGNOSIS — Z7982 Long term (current) use of aspirin: Secondary | ICD-10-CM | POA: Diagnosis not present

## 2016-02-21 DIAGNOSIS — R042 Hemoptysis: Secondary | ICD-10-CM | POA: Diagnosis not present

## 2016-02-21 DIAGNOSIS — I8501 Esophageal varices with bleeding: Secondary | ICD-10-CM | POA: Diagnosis not present

## 2016-02-21 HISTORY — PX: ESOPHAGOGASTRODUODENOSCOPY: SHX5428

## 2016-02-21 LAB — CBC
HEMATOCRIT: 23 % — AB (ref 39.0–52.0)
HEMATOCRIT: 21.2 % — AB (ref 39.0–52.0)
HEMOGLOBIN: 6.7 g/dL — AB (ref 13.0–17.0)
Hemoglobin: 7.2 g/dL — ABNORMAL LOW (ref 13.0–17.0)
MCH: 24.7 pg — AB (ref 26.0–34.0)
MCH: 24.6 pg — AB (ref 26.0–34.0)
MCHC: 31.3 g/dL (ref 30.0–36.0)
MCHC: 31.6 g/dL (ref 30.0–36.0)
MCV: 78.8 fL (ref 78.0–100.0)
MCV: 77.9 fL — ABNORMAL LOW (ref 78.0–100.0)
PLATELETS: 86 10*3/uL — AB (ref 150–400)
Platelets: 87 10*3/uL — ABNORMAL LOW (ref 150–400)
RBC: 2.92 MIL/uL — ABNORMAL LOW (ref 4.22–5.81)
RBC: 2.72 MIL/uL — ABNORMAL LOW (ref 4.22–5.81)
RDW: 18.2 % — AB (ref 11.5–15.5)
RDW: 18.3 % — AB (ref 11.5–15.5)
WBC: 3.8 10*3/uL — ABNORMAL LOW (ref 4.0–10.5)
WBC: 3.2 10*3/uL — ABNORMAL LOW (ref 4.0–10.5)

## 2016-02-21 LAB — COMPREHENSIVE METABOLIC PANEL
ALBUMIN: 2.8 g/dL — AB (ref 3.5–5.0)
ALT: 22 U/L (ref 17–63)
ANION GAP: 4 — AB (ref 5–15)
AST: 32 U/L (ref 15–41)
Alkaline Phosphatase: 142 U/L — ABNORMAL HIGH (ref 38–126)
BILIRUBIN TOTAL: 1.1 mg/dL (ref 0.3–1.2)
BUN: 15 mg/dL (ref 6–20)
CO2: 22 mmol/L (ref 22–32)
Calcium: 7.9 mg/dL — ABNORMAL LOW (ref 8.9–10.3)
Chloride: 109 mmol/L (ref 101–111)
Creatinine, Ser: 0.68 mg/dL (ref 0.61–1.24)
GFR calc Af Amer: >60 mL/min (ref >60)
GFR calc non Af Amer: >60 mL/min (ref >60)
GLUCOSE: 138 mg/dL — AB (ref 65–99)
POTASSIUM: 4.4 mmol/L (ref 3.5–5.1)
SODIUM: 135 mmol/L (ref 135–145)
TOTAL PROTEIN: 5.8 g/dL — AB (ref 6.5–8.1)

## 2016-02-21 LAB — CBC WITH DIFFERENTIAL/PLATELET
BASOS ABS: 0 10*3/uL (ref 0.0–0.1)
BASOS PCT: 0 %
EOS ABS: 0 10*3/uL (ref 0.0–0.7)
Eosinophils Relative: 0 %
HEMATOCRIT: 25.1 % — AB (ref 39.0–52.0)
HEMOGLOBIN: 7.9 g/dL — AB (ref 13.0–17.0)
Lymphocytes Relative: 7 %
Lymphs Abs: 0.5 10*3/uL — ABNORMAL LOW (ref 0.7–4.0)
MCH: 24.2 pg — ABNORMAL LOW (ref 26.0–34.0)
MCHC: 31.5 g/dL (ref 30.0–36.0)
MCV: 77 fL — ABNORMAL LOW (ref 78.0–100.0)
MONO ABS: 0.5 10*3/uL (ref 0.1–1.0)
Monocytes Relative: 7 %
NEUTROS ABS: 6.3 10*3/uL (ref 1.7–7.7)
NEUTROS PCT: 86 %
Platelets: 110 10*3/uL — ABNORMAL LOW (ref 150–400)
RBC: 3.26 MIL/uL — ABNORMAL LOW (ref 4.22–5.81)
RDW: 18 % — AB (ref 11.5–15.5)
WBC: 7.3 10*3/uL (ref 4.0–10.5)

## 2016-02-21 LAB — GLUCOSE, CAPILLARY
GLUCOSE-CAPILLARY: 133 mg/dL — AB (ref 65–99)
GLUCOSE-CAPILLARY: 137 mg/dL — AB (ref 65–99)
GLUCOSE-CAPILLARY: 107 mg/dL — AB (ref 65–99)

## 2016-02-21 LAB — MRSA PCR SCREENING: MRSA BY PCR: NEGATIVE

## 2016-02-21 LAB — LIPASE, BLOOD: Lipase: 46 U/L (ref 11–51)

## 2016-02-21 LAB — PROTIME-INR
INR: 1.27
Prothrombin Time: 16 seconds — ABNORMAL HIGH (ref 11.4–15.2)

## 2016-02-21 SURGERY — EGD (ESOPHAGOGASTRODUODENOSCOPY)
Anesthesia: General

## 2016-02-21 SURGERY — EGD (ESOPHAGOGASTRODUODENOSCOPY)
Anesthesia: Moderate Sedation

## 2016-02-21 MED ORDER — SODIUM CHLORIDE 0.9 % IV SOLN
INTRAVENOUS | Status: DC
  Administered 2016-02-21 (×3): via INTRAVENOUS
  Administered 2016-02-22 – 2016-02-23 (×2): 1000 mL via INTRAVENOUS

## 2016-02-21 MED ORDER — MIDAZOLAM HCL 10 MG/2ML IJ SOLN
INTRAMUSCULAR | Status: DC | PRN
Start: 2016-02-21 — End: 2016-02-21
  Administered 2016-02-21 (×3): 2 mg via INTRAVENOUS

## 2016-02-21 MED ORDER — METOCLOPRAMIDE HCL 10 MG PO TABS: 10.0000 mg | ORAL_TABLET | Freq: Once | ORAL | Status: DC

## 2016-02-21 MED ORDER — ONDANSETRON HCL 4 MG/2ML IJ SOLN
4.0000 mg | Freq: Four times a day (QID) | INTRAMUSCULAR | Status: DC | PRN
  Administered 2016-02-22 – 2016-02-23 (×2): 4 mg via INTRAVENOUS
  Filled 2016-02-21 (×2): qty 2

## 2016-02-21 MED ORDER — DIPHENHYDRAMINE HCL 50 MG/ML IJ SOLN
INTRAMUSCULAR | Status: DC | PRN
  Administered 2016-02-21: 25 mg via INTRAVENOUS

## 2016-02-21 MED ORDER — MIDAZOLAM HCL 5 MG/ML IJ SOLN
INTRAMUSCULAR | Status: AC
  Filled 2016-02-21: qty 3

## 2016-02-21 MED ORDER — METOCLOPRAMIDE HCL 5 MG/ML IJ SOLN
10.0000 mg | Freq: Once | INTRAMUSCULAR | Status: AC
  Administered 2016-02-21: 10 mg via INTRAVENOUS
  Filled 2016-02-21: qty 2

## 2016-02-21 MED ORDER — RANITIDINE HCL 150 MG/10ML PO SYRP
300.0000 mg | ORAL_SOLUTION | Freq: Once | ORAL | Status: AC
  Administered 2016-02-21: 300 mg via ORAL
  Filled 2016-02-21: qty 20

## 2016-02-21 MED ORDER — SODIUM CHLORIDE 0.9 % IV SOLN
80.0000 mg | Freq: Once | INTRAVENOUS | Status: AC
  Administered 2016-02-21: 80 mg via INTRAVENOUS
  Filled 2016-02-21: qty 80

## 2016-02-21 MED ORDER — DEXTROSE 5 % IV SOLN
2.0000 g | Freq: Once | INTRAVENOUS | Status: AC
  Administered 2016-02-21: 2 g via INTRAVENOUS
  Filled 2016-02-21: qty 2

## 2016-02-21 MED ORDER — SODIUM CHLORIDE 0.9% FLUSH: 10.0000 mL | INTRAVENOUS | Status: DC | PRN

## 2016-02-21 MED ORDER — INSULIN ASPART 100 UNIT/ML ~~LOC~~ SOLN
0.0000 [IU] | Freq: Three times a day (TID) | SUBCUTANEOUS | Status: DC
  Administered 2016-02-22: 3 [IU] via SUBCUTANEOUS
  Administered 2016-02-22 – 2016-02-23 (×2): 2 [IU] via SUBCUTANEOUS
  Administered 2016-02-23: 1 [IU] via SUBCUTANEOUS
  Administered 2016-02-24: 5 [IU] via SUBCUTANEOUS
  Administered 2016-02-24 (×2): 1 [IU] via SUBCUTANEOUS
  Administered 2016-02-25: 2 [IU] via SUBCUTANEOUS
  Administered 2016-02-25 (×2): 1 [IU] via SUBCUTANEOUS
  Administered 2016-02-26: 2 [IU] via SUBCUTANEOUS

## 2016-02-21 MED ORDER — ONDANSETRON HCL 4 MG PO TABS: 4.0000 mg | ORAL_TABLET | Freq: Four times a day (QID) | ORAL | Status: DC | PRN

## 2016-02-21 MED ORDER — DEXTROSE 5 % IV SOLN
1.0000 g | INTRAVENOUS | Status: DC
  Administered 2016-02-22: 1 g via INTRAVENOUS
  Filled 2016-02-21: qty 10

## 2016-02-21 MED ORDER — FENTANYL CITRATE (PF) 100 MCG/2ML IJ SOLN
INTRAMUSCULAR | Status: AC
  Filled 2016-02-21: qty 2

## 2016-02-21 MED ORDER — SODIUM CHLORIDE 0.9 % IV SOLN
50.0000 ug/h | INTRAVENOUS | Status: DC
  Administered 2016-02-22 – 2016-02-25 (×6): 50 ug/h via INTRAVENOUS
  Filled 2016-02-21 (×20): qty 1

## 2016-02-21 MED ORDER — SODIUM CHLORIDE 0.9 % IV SOLN
8.0000 mg/h | INTRAVENOUS | Status: DC
  Administered 2016-02-21 – 2016-02-23 (×4): 8 mg/h via INTRAVENOUS
  Filled 2016-02-21 (×9): qty 80

## 2016-02-21 MED ORDER — SODIUM CHLORIDE 0.9 % IV BOLUS (SEPSIS)
1000.0000 mL | Freq: Once | INTRAVENOUS | Status: AC
  Administered 2016-02-21: 1000 mL via INTRAVENOUS

## 2016-02-21 MED ORDER — DIPHENHYDRAMINE HCL 50 MG/ML IJ SOLN
INTRAMUSCULAR | Status: AC
  Filled 2016-02-21: qty 1

## 2016-02-21 MED ORDER — ONDANSETRON HCL 4 MG/2ML IJ SOLN
INTRAMUSCULAR | Status: AC
  Filled 2016-02-21: qty 2

## 2016-02-21 MED ORDER — PANTOPRAZOLE SODIUM 40 MG IV SOLR: 40.0000 mg | Freq: Two times a day (BID) | INTRAVENOUS | Status: DC

## 2016-02-21 MED ORDER — HYDROMORPHONE HCL 1 MG/ML IJ SOLN
0.5000 mg | INTRAMUSCULAR | Status: DC | PRN
Start: 2016-02-21 — End: 2016-02-26
  Administered 2016-02-21 – 2016-02-24 (×12): 1 mg via INTRAVENOUS
  Administered 2016-02-24: 0.5 mg via INTRAVENOUS
  Administered 2016-02-24: 1 mg via INTRAVENOUS
  Administered 2016-02-24: 0.5 mg via INTRAVENOUS
  Administered 2016-02-25 (×3): 1 mg via INTRAVENOUS
  Filled 2016-02-21 (×17): qty 1

## 2016-02-21 MED ORDER — ONDANSETRON HCL 4 MG/2ML IJ SOLN
4.0000 mg | Freq: Once | INTRAMUSCULAR | Status: AC
  Administered 2016-02-21: 4 mg via INTRAVENOUS

## 2016-02-21 MED ORDER — OCTREOTIDE LOAD VIA INFUSION
50.0000 ug | Freq: Once | INTRAVENOUS | Status: AC
  Administered 2016-02-21: 50 ug via INTRAVENOUS
  Filled 2016-02-21: qty 25

## 2016-02-21 MED ORDER — FENTANYL CITRATE (PF) 100 MCG/2ML IJ SOLN
INTRAMUSCULAR | Status: DC | PRN
  Administered 2016-02-21 (×3): 25 ug via INTRAVENOUS

## 2016-02-21 NOTE — Interval H&P Note (Signed)
History and Physical Interval Note:  02/21/2016 6:20 PM  James Russell  has presented today for surgery, with the diagnosis of Variceal Bleed  The various methods of treatment have been discussed with the patient and family. After consideration of risks, benefits and other options for treatment, the patient has consented to  Procedure(s): ESOPHAGOGASTRODUODENOSCOPY (EGD) (N/A) as a surgical intervention .  The patient's history has been reviewed, patient examined, no change in status, stable for surgery.  I have reviewed the patient's chart and labs.  Questions were answered to the patient's satisfaction.    Assessment:  1.  Hematemesis.  None for about 16 hours.  Hemodynamically stable. 2.  Cirrhosis with prior variceal bleeding. 3.  Thrombocytopenia, platelets ~ 86k.  Plan:  1.  Endoscopy with possible esophageal variceal band ligation or treatment of bleeding source. 2.  Risks (bleeding, infection, bowel perforation that could require surgery, sedation-related changes in cardiopulmonary systems), benefits (identification and possible treatment of source of symptoms, exclusion of certain causes of symptoms), and alternatives (watchful waiting, radiographic imaging studies, empiric medical treatment) of upper endoscopy (EGD) were explained to patient/family in detail and patient wishes to proceed.  James Russell

## 2016-02-21 NOTE — Op Note (Signed)
[facility] Patient Name: James Russell Procedure Date: 02/21/2016 MRN: [id number] Attending MD: Arta Silence , MD Date of Birth: October 20, 1960 CSN: [id number] Age: 55 Admit Type: Inpatient Procedure:                Upper GI endoscopy Indications:              Hematemesis, cirrhosis Providers:                Arta Silence, MD, Laverta Baltimore RN, RN, Cherylynn Ridges, Technician Referring MD:              Medicines:                Fentanyl 75 micrograms IV, Midazolam 6 mg IV,                            Diphenhydramine 25 mg IV Complications:            No immediate complications. Estimated Blood Loss:     Estimated blood loss: none. Procedure:                Pre-Anesthesia Assessment:                           - Prior to the procedure, a History and Physical                            was performed, and patient medications and                            allergies were reviewed. The patient's tolerance of                            previous anesthesia was also reviewed. The risks                            and benefits of the procedure and the sedation                            options and risks were discussed with the patient.                            All questions were answered, and informed consent                            was obtained. Prior Anticoagulants: The patient has                            taken no previous anticoagulant or antiplatelet                            agents. ASA Grade Assessment: III - A patient with  severe systemic disease. After reviewing the risks                            and benefits, the patient was deemed in                            satisfactory condition to undergo the procedure.                           After obtaining informed consent, the endoscope was                            passed under direct vision. Throughout the                            procedure, the patient's  blood pressure, pulse, and                            oxygen saturations were monitored continuously. The                            [id number]) scope was introduced through the                            mouth, and advanced to the second part of duodenum.                            The upper GI endoscopy was accomplished without                            difficulty. The patient tolerated the procedure                            well. Scope In: Scope Out: Findings:      Two columns of Grade I-II varices were found in the lower third of the       esophagus. Some red wale signs. No active bleeding. One small nipple on       one of the columns of varices; varices too small for banding.      A small hiatal hernia was present.      The exam of the esophagus was otherwise normal.      Mild portal hypertensive gastropathy was found in the stomach.      The exam of the stomach was otherwise normal.      The duodenal bulb, first portion of the duodenum and second portion of       the duodenum were normal. No old or fresh blood seen to extent of our       exam. Estimated blood loss: none. Impression:               - Grade I-II esophageal varices. Likely source of                            bleeding, but varices too small to band at the  present time.                           - Small hiatal hernia.                           - Portal hypertensive gastropathy.                           - Normal duodenal bulb, first portion of the                            duodenum and second portion of the duodenum.                           - No old or fresh blood seen to the extent of our                            examination. Moderate Sedation:      Moderate (conscious) sedation was administered by the endoscopy nurse       and supervised by the endoscopist. The following parameters were       monitored: oxygen saturation, heart rate, blood pressure, and response       to  care. Recommendation:           - Observe patient in ICU for ongoing care.                           Ferrel Logan of clear liquids; do not advance until [facility]                            GI follows up with patient tomorrow.                           - Continue present medications, including                            octreotide gtt and pantoprazole gtt.                           [facility] will follow. Procedure Code(s):        --- Professional ---                           [phone], Esophagogastroduodenoscopy, flexible,                            transoral; diagnostic, including collection of                            specimen(s) by brushing or washing, when performed                            (separate procedure) Diagnosis Code(s):        --- Professional ---  I85.00, Esophageal varices without bleeding                           K44.9, Diaphragmatic hernia without obstruction or                            gangrene                           K76.6, Portal hypertension                           K31.89, Other diseases of stomach and duodenum                           K92.0, Hematemesis CPT copyright 2016 [employer]. All rights reserved. The codes documented in this report are preliminary and upon coder review may  be revised to meet current compliance requirements. Arta Silence, MD 02/21/2016 6:42:32 PM This report has been signed electronically. Number of Addenda: 0

## 2016-02-21 NOTE — H&P (View-Only) (Signed)
Subjective:   HPI  The patient is a 55 year old male with a history of colon cancer resected in the past but with metastases to the liver undergoing chemotherapy. He also has a history of esophageal varices and about a month ago had an upper GI bleed and underwent upper endoscopy with esophageal band ligation. He was to come back for repeat endoscopy the first week of December for further investigation and to see whether he need further esophageal banding. Last evening he had an episode of hematemesis and came to the hospital where he was subsequently admitted. He has not had any hematemesis in the past several hours. He has been started on an octreotide drip.  Review of Systems No chest pain or shortness of breath  Past Medical History:  Diagnosis Date  . Cancer ([place])    liver  . H. pylori infection   . Hypercholesteremia   . Hypertension   . Iron deficiency   . MI (myocardial infarction)    Past Surgical History:  Procedure Laterality Date  . CORONARY ARTERY BYPASS GRAFT    . ESOPHAGOGASTRODUODENOSCOPY N/A 01/25/2016   Procedure: ESOPHAGOGASTRODUODENOSCOPY (EGD);  Surgeon: Teena Irani, MD;  Location: [facility];  Service: Endoscopy;  Laterality: N/A;  . implanted port     Social History   Social History  . Marital status: Soil scientist    Spouse name: N/A  . Number of children: N/A  . Years of education: N/A   Occupational History  . Not on file.   Social History Main Topics  . Smoking status: Current Some Day Smoker  . Smokeless tobacco: Never Used  . Alcohol use No  . Drug use: No  . Sexual activity: Not on file   Other Topics Concern  . Not on file   Social History Narrative  . No narrative on file   family history includes Heart Problems in his father.  Current Facility-Administered Medications:  .  0.9 %  sodium chloride infusion, , Intravenous, Continuous, Edwin Dada, MD, Last Rate: 125 mL/hr at 02/21/16 0617 .  [START ON 02/22/2016]  cefTRIAXone (ROCEPHIN) 1 g in dextrose 5 % 50 mL IVPB, 1 g, Intravenous, Q24H, Edwin Dada, MD .  HYDROmorphone (DILAUDID) injection 0.5-1 mg, 0.5-1 mg, Intravenous, Q4H PRN, Edwin Dada, MD, 1 mg at 02/21/16 [id number] .  insulin aspart (novoLOG) injection 0-9 Units, 0-9 Units, Subcutaneous, Q8H, Edwin Dada, MD .  [COMPLETED] octreotide (SANDOSTATIN) 2 mcg/mL load via infusion 50 mcg, 50 mcg, Intravenous, Once, 50 mcg at 02/21/16 0500 **AND** octreotide (SANDOSTATIN) 500 mcg in sodium chloride 0.9 % 250 mL (2 mcg/mL) infusion, 50 mcg/hr, Intravenous, Continuous, Everlene Balls, MD, Last Rate: 25 mL/hr at 02/21/16 0600, 50 mcg/hr at 02/21/16 0600 .  ondansetron (ZOFRAN) tablet 4 mg, 4 mg, Oral, Q6H PRN **OR** ondansetron (ZOFRAN) injection 4 mg, 4 mg, Intravenous, Q6H PRN, Edwin Dada, MD .  pantoprazole (PROTONIX) 80 mg in sodium chloride 0.9 % 250 mL (0.32 mg/mL) infusion, 8 mg/hr, Intravenous, Continuous, Everlene Balls, MD, Last Rate: 25 mL/hr at 02/21/16 0600, 8 mg/hr at 02/21/16 0600 Allergies  Allergen Reactions  . Tylenol [Acetaminophen]     Chills     Objective:     BP 114/64 (BP Location: Left Arm)   Pulse 95   Temp 98.5 F (36.9 C) (Oral)   Resp 15   Ht 5\' 11"  (1.803 m)   Wt 81.6 kg (180 lb)   SpO2 98%   BMI 25.10 kg/m  He is in no distress  Nonicteric  Heart regular rhythm no murmurs  Lungs clear  Abdomen: Bowel sounds normal, soft, nontender  Laboratory No components found for: D1    Assessment:     #1. Upper GI bleed. This could be secondary to recurrence of esophageal varices versus gastritis versus Mallory-Weiss tear.  #2. Metastatic colon cancer to the liver      Plan:     Continue PPI therapy. Continue octreotide. We will plan EGD later today with possible repeat ligation of varices. Lab Results  Component Value Date   HGB 7.2 (L) 02/21/2016   HGB 7.9 (L) 02/21/2016   HGB 8.8 (L) 02/02/2016   HCT 23.0 (L)  02/21/2016   HCT 25.1 (L) 02/21/2016   HCT 26.9 (L) 02/02/2016   ALKPHOS 142 (H) 02/21/2016   ALKPHOS 111 01/25/2016   ALKPHOS 56 05/29/2008   AST 32 02/21/2016   AST 37 01/25/2016   AST 16 05/29/2008   ALT 22 02/21/2016   ALT 32 01/25/2016   ALT 22 05/29/2008

## 2016-02-21 NOTE — ED Provider Notes (Addendum)
[facility] DEPT Provider Note   CSN: [id number] Arrival date & time: 02/20/16  2320     History   Chief Complaint Chief Complaint  Patient presents with  . Hematemesis    HPI James Russell is a 55 y.o. male.With a past history of liver cancer on chemotherapy, esophageal varices, presenting today with hematemesis. He states his occurred tonight with large amounts of bright red blood. He has a history of banding for these varices. He denies any blood in his stool. He continues to have a nausea and indigestion feeling currently. He did not take any new medications and has been compliant with his normal medications. He denies any recent illnesses. There are no further complaints.  10 Systems reviewed and are negative for acute change except as noted in the HPI.   HPI  Past Medical History:  Diagnosis Date  . Cancer ([place])    liver  . H. pylori infection   . Hypercholesteremia   . Hypertension   . Iron deficiency   . MI (myocardial infarction)     Patient Active Problem List   Diagnosis Date Noted  . Fluid overload, unspecified 02/02/2016  . Thrombocytopenia ([place]) 01/31/2016  . Metastatic colon cancer to liver ([place]) 01/31/2016  . Peripheral neuropathy due to chemotherapy ([place]) 01/31/2016  . Left inguinal hernia 01/30/2016  . Acute hypoxemic respiratory failure ([place])   . Bleeding esophageal varices ([place])   . Acute encephalopathy   . Hypotension   . Acute blood loss anemia     Past Surgical History:  Procedure Laterality Date  . CORONARY ARTERY BYPASS GRAFT    . ESOPHAGOGASTRODUODENOSCOPY N/A 01/25/2016   Procedure: ESOPHAGOGASTRODUODENOSCOPY (EGD);  Surgeon: Teena Irani, MD;  Location: [facility];  Service: Endoscopy;  Laterality: N/A;  . implanted port         Home Medications    Prior to Admission medications   Medication Sig Start Date End Date Taking? Authorizing Provider  ALPRAZolam Duanne Moron) 0.5 MG tablet Take 0.5 mg by mouth at bedtime as needed for  anxiety.   Yes Historical Provider, MD  aspirin 81 MG chewable tablet Chew 81 mg by mouth daily.   Yes Historical Provider, MD  atenolol (TENORMIN) 100 MG tablet Take 50 mg by mouth daily.   Yes Historical Provider, MD  atorvastatin (LIPITOR) 80 MG tablet Take 80 mg by mouth daily.   Yes Historical Provider, MD  diclofenac sodium (VOLTAREN) 1 % GEL Apply 2 g topically 4 (four) times daily. To left groin 01/31/16  Yes Debbe Odea, MD  furosemide (LASIX) 40 MG tablet Take 1 tablet (40 mg total) by mouth 2 (two) times daily. 02/02/16  Yes Debbe Odea, MD  LONSURF 15-6.14 MG tablet Take 5 tablets by mouth 2 (two) times daily. 02/07/16  Yes Historical Provider, MD  nitroGLYCERIN (NITROSTAT) 0.4 MG SL tablet Place 0.4 mg under the tongue every 5 (five) minutes as needed for chest pain.   Yes Historical Provider, MD  omeprazole (PRILOSEC) 40 MG capsule Take 40 mg by mouth daily.   Yes Historical Provider, MD  ondansetron (ZOFRAN-ODT) 8 MG disintegrating tablet Take 8 mg by mouth every 8 (eight) hours as needed for nausea or vomiting.   Yes Historical Provider, MD  oxyCODONE (OXY IR/ROXICODONE) 5 MG immediate release tablet Take 10 mg by mouth every 4 (four) hours as needed for severe pain.   Yes Historical Provider, MD  pregabalin (LYRICA) 150 MG capsule Take 1 capsule (150 mg total) by mouth 3 (three) times  daily. 01/31/16  Yes Debbe Odea, MD  ramipril (ALTACE) 5 MG capsule Take 5 mg by mouth 2 (two) times daily.   Yes Historical Provider, MD  zolpidem (AMBIEN) 10 MG tablet Take 10 mg by mouth at bedtime as needed for sleep.   Yes Historical Provider, MD  ciprofloxacin (CIPRO) 500 MG tablet Take 1 tablet (500 mg total) by mouth 2 (two) times daily. One po bid x 7 days Patient not taking: Reported on 02/21/2016 02/02/16   Debbe Odea, MD  ketotifen (ZADITOR) 0.025 % ophthalmic solution Place 1 drop into both eyes 2 (two) times daily. Patient not taking: Reported on 02/21/2016 01/31/16   Debbe Odea, MD    potassium chloride 20 MEQ TBCR Take 20 mEq by mouth daily. Patient not taking: Reported on 02/21/2016 02/02/16   Debbe Odea, MD    Family History History reviewed. No pertinent family history.  Social History Social History  Substance Use Topics  . Smoking status: Current Some Day Smoker  . Smokeless tobacco: Never Used  . Alcohol use No     Allergies   Tylenol [acetaminophen]   Review of Systems Review of Systems   Physical Exam Updated Vital Signs BP 117/72   Pulse 99   Temp 99.7 F (37.6 C) (Oral)   Resp 17   Ht 5\' 11"  (1.803 m)   Wt 180 lb (81.6 kg)   SpO2 100%   BMI 25.10 kg/m   Physical Exam  Constitutional: He is oriented to person, place, and time. Vital signs are normal. He appears well-developed and well-nourished.  Non-toxic appearance. He does not appear ill. No distress.  HENT:  Head: Normocephalic and atraumatic.  Nose: Nose normal.  Mouth/Throat: Oropharynx is clear and moist. No oropharyngeal exudate.  Eyes: Conjunctivae and EOM are normal. Pupils are equal, round, and reactive to light. No scleral icterus.  Neck: Normal range of motion. Neck supple. No tracheal deviation, no edema, no erythema and normal range of motion present. No thyroid mass and no thyromegaly present.  Cardiovascular: Regular rhythm, S1 normal, S2 normal, normal heart sounds, intact distal pulses and normal pulses.  Exam reveals no gallop and no friction rub.   No murmur heard. tachycardia  Pulmonary/Chest: Effort normal and breath sounds normal. No respiratory distress. He has no wheezes. He has no rhonchi. He has no rales.  Abdominal: Soft. Normal appearance and bowel sounds are normal. He exhibits no distension, no ascites and no mass. There is no hepatosplenomegaly. There is no tenderness. There is no rebound, no guarding and no CVA tenderness.  Musculoskeletal: Normal range of motion. He exhibits no edema or tenderness.  Lymphadenopathy:    He has no cervical  adenopathy.  Neurological: He is alert and oriented to person, place, and time. He has normal strength. No cranial nerve deficit or sensory deficit.  Skin: Skin is warm, dry and intact. No petechiae and no rash noted. He is not diaphoretic. No erythema. No pallor.  Nursing note and vitals reviewed.    ED Treatments / Results  Labs (all labs ordered are listed, but only abnormal results are displayed) Labs Reviewed  CBC WITH DIFFERENTIAL/PLATELET - Abnormal; Notable for the following:       Result Value   RBC 3.26 (*)    Hemoglobin 7.9 (*)    HCT 25.1 (*)    MCV 77.0 (*)    MCH 24.2 (*)    RDW 18.0 (*)    Platelets 110 (*)    Lymphs Abs 0.5 (*)  All other components within normal limits  COMPREHENSIVE METABOLIC PANEL - Abnormal; Notable for the following:    Glucose, Bld 138 (*)    Calcium 7.9 (*)    Total Protein 5.8 (*)    Albumin 2.8 (*)    Alkaline Phosphatase 142 (*)    Anion gap 4 (*)    All other components within normal limits  PROTIME-INR - Abnormal; Notable for the following:    Prothrombin Time 16.0 (*)    All other components within normal limits  LIPASE, BLOOD  TYPE AND SCREEN    EKG  EKG Interpretation None       Radiology No results found.  Procedures Procedures (including critical care time)  Medications Ordered in ED Medications  pantoprazole (PROTONIX) 80 mg in sodium chloride 0.9 % 100 mL IVPB (not administered)  pantoprazole (PROTONIX) 80 mg in sodium chloride 0.9 % 250 mL (0.32 mg/mL) infusion (not administered)  pantoprazole (PROTONIX) injection 40 mg (not administered)  ranitidine (ZANTAC) 150 MG/10ML syrup 300 mg (not administered)  metoCLOPramide (REGLAN) tablet 10 mg (not administered)  octreotide (SANDOSTATIN) 2 mcg/mL load via infusion 50 mcg (not administered)    And  octreotide (SANDOSTATIN) 500 mcg in sodium chloride 0.9 % 250 mL (2 mcg/mL) infusion (not administered)  cefTRIAXone (ROCEPHIN) 2 g in dextrose 5 % 50 mL IVPB  (not administered)  sodium chloride 0.9 % bolus 1,000 mL (0 mLs Intravenous Stopped 02/21/16 0302)     Initial Impression / Assessment and Plan / ED Course  I have reviewed the triage vital signs and the nursing notes.  Pertinent labs & imaging results that were available during my care of the patient were reviewed by me and considered in my medical decision making (see chart for details).  Clinical Course     Patient presents to the emergency department for hematemesis. In the setting of possible varices, patient will require admission for gastroenterology consultation. He was placed on Protonix and octreotide drip. He was given ceftriaxone. Hemoglobin is 7.9 which is an acute drop. Patient was a require blood transfusion as he has history of cardiac stents. Will discuss with hospitalist.   4:37 AM Patient accepted to stepdown.  Dr. Loleta Books will evaluate the patient and lab work.   Final Clinical Impressions(s) / ED Diagnoses   Final diagnoses:  None    New Prescriptions New Prescriptions   No medications on file     Everlene Balls, MD 02/21/16 0437   5:54 AM I spoke with Dr. Cristina Gong with [facility] who will set the patient up to be evaluated emergently.   Everlene Balls, MD 02/21/16 [phone]

## 2016-02-21 NOTE — Consult Note (Signed)
Subjective:   HPI  The patient is a 55 year old male with a history of colon cancer resected in the past but with metastases to the liver undergoing chemotherapy. He also has a history of esophageal varices and about a month ago had an upper GI bleed and underwent upper endoscopy with esophageal band ligation. He was to come back for repeat endoscopy the first week of December for further investigation and to see whether he need further esophageal banding. Last evening he had an episode of hematemesis and came to the hospital where he was subsequently admitted. He has not had any hematemesis in the past several hours. He has been started on an octreotide drip.  Review of Systems No chest pain or shortness of breath  Past Medical History:  Diagnosis Date  . Cancer ([place])    liver  . H. pylori infection   . Hypercholesteremia   . Hypertension   . Iron deficiency   . MI (myocardial infarction)    Past Surgical History:  Procedure Laterality Date  . CORONARY ARTERY BYPASS GRAFT    . ESOPHAGOGASTRODUODENOSCOPY N/A 01/25/2016   Procedure: ESOPHAGOGASTRODUODENOSCOPY (EGD);  Surgeon: Teena Irani, MD;  Location: [facility];  Service: Endoscopy;  Laterality: N/A;  . implanted port     Social History   Social History  . Marital status: Soil scientist    Spouse name: N/A  . Number of children: N/A  . Years of education: N/A   Occupational History  . Not on file.   Social History Main Topics  . Smoking status: Current Some Day Smoker  . Smokeless tobacco: Never Used  . Alcohol use No  . Drug use: No  . Sexual activity: Not on file   Other Topics Concern  . Not on file   Social History Narrative  . No narrative on file   family history includes Heart Problems in his father.  Current Facility-Administered Medications:  .  0.9 %  sodium chloride infusion, , Intravenous, Continuous, Edwin Dada, MD, Last Rate: 125 mL/hr at 02/21/16 0617 .  [START ON 02/22/2016]  cefTRIAXone (ROCEPHIN) 1 g in dextrose 5 % 50 mL IVPB, 1 g, Intravenous, Q24H, Edwin Dada, MD .  HYDROmorphone (DILAUDID) injection 0.5-1 mg, 0.5-1 mg, Intravenous, Q4H PRN, Edwin Dada, MD, 1 mg at 02/21/16 [id number] .  insulin aspart (novoLOG) injection 0-9 Units, 0-9 Units, Subcutaneous, Q8H, Edwin Dada, MD .  [COMPLETED] octreotide (SANDOSTATIN) 2 mcg/mL load via infusion 50 mcg, 50 mcg, Intravenous, Once, 50 mcg at 02/21/16 0500 **AND** octreotide (SANDOSTATIN) 500 mcg in sodium chloride 0.9 % 250 mL (2 mcg/mL) infusion, 50 mcg/hr, Intravenous, Continuous, Everlene Balls, MD, Last Rate: 25 mL/hr at 02/21/16 0600, 50 mcg/hr at 02/21/16 0600 .  ondansetron (ZOFRAN) tablet 4 mg, 4 mg, Oral, Q6H PRN **OR** ondansetron (ZOFRAN) injection 4 mg, 4 mg, Intravenous, Q6H PRN, Edwin Dada, MD .  pantoprazole (PROTONIX) 80 mg in sodium chloride 0.9 % 250 mL (0.32 mg/mL) infusion, 8 mg/hr, Intravenous, Continuous, Everlene Balls, MD, Last Rate: 25 mL/hr at 02/21/16 0600, 8 mg/hr at 02/21/16 0600 Allergies  Allergen Reactions  . Tylenol [Acetaminophen]     Chills     Objective:     BP 114/64 (BP Location: Left Arm)   Pulse 95   Temp 98.5 F (36.9 C) (Oral)   Resp 15   Ht 5\' 11"  (1.803 m)   Wt 81.6 kg (180 lb)   SpO2 98%   BMI 25.10 kg/m  He is in no distress  Nonicteric  Heart regular rhythm no murmurs  Lungs clear  Abdomen: Bowel sounds normal, soft, nontender  Laboratory No components found for: D1    Assessment:     #1. Upper GI bleed. This could be secondary to recurrence of esophageal varices versus gastritis versus Mallory-Weiss tear.  #2. Metastatic colon cancer to the liver      Plan:     Continue PPI therapy. Continue octreotide. We will plan EGD later today with possible repeat ligation of varices. Lab Results  Component Value Date   HGB 7.2 (L) 02/21/2016   HGB 7.9 (L) 02/21/2016   HGB 8.8 (L) 02/02/2016   HCT 23.0 (L)  02/21/2016   HCT 25.1 (L) 02/21/2016   HCT 26.9 (L) 02/02/2016   ALKPHOS 142 (H) 02/21/2016   ALKPHOS 111 01/25/2016   ALKPHOS 56 05/29/2008   AST 32 02/21/2016   AST 37 01/25/2016   AST 16 05/29/2008   ALT 22 02/21/2016   ALT 32 01/25/2016   ALT 22 05/29/2008

## 2016-02-21 NOTE — Progress Notes (Signed)
Initial Nutrition Assessment  DOCUMENTATION CODES:   Non-severe (moderate) malnutrition in context of acute illness/injury  INTERVENTION:  - Diet advancement as medically feasible. - RD will continue to monitor for nutrition-related needs.  NUTRITION DIAGNOSIS:   Increased nutrient needs related to catabolic illness, cancer and cancer related treatments as evidenced by estimated needs.  GOAL:   Patient will meet greater than or equal to 90% of their needs  MONITOR:   Diet advancement, PO intake, Weight trends, Labs, I & O's  REASON FOR ASSESSMENT:   Malnutrition Screening Tool  ASSESSMENT:   55 y.o. male with a past medical history significant for colon cancer metastatic to liver and lungs on trifluridine/tipiracil (Lonsurf), recent variceal bleed requiring intubation and banding in late Oct  who presents with hematemesis. The patient was admitted to [facility] from 10/26 to 11/3 after developing nausea followed by vomiting bright red blood and presenting to the ER with hypotension and Hgb 7.  He had emergent EGD in the ER during which 3 varices were banded, he was given blood transfusion and treated with oral cipro empirically for SBP. Since discharge, he has been back to his baseline. He had a Hgb check two days ago with Hgb 10.5. On Monday, he started a new chemotherapy agent Lonsurf. Then tonight, he had mac and cheese for dinner and shortly after started to feel nauseated and then vomited a very large bright red bloody vomit.  He called EMS, and when they arrived, vomited 4 more times bright red blood.  Pt seen for MST. BMI indicates overweight status. Pt has been NPO since admission. He reports that he has typically had a good appetite while on oral chemo but that appetite began to decrease ~1 week ago; he denies feelings of nausea or abdominal pain during that time but that he simply felt overly full and had no desire to eat. He states that while on oral chemo he has had taste  alteration ("chemo taste") and states that it is similar to a metallic taste; he states he has experienced this in the past with chemo treatments.   Pt states that he has noticed a decrease in strength and that he has been feeling weaker but that he has still been able to do ADLs and yard work. Physical assessment shows mild muscle and fat wasting. Mild edema to BLEs. Pt reports he takes Lasix at home d/t BLE edema and that he does not think he has been given Lasix since admission.   He reports UBW of 180 lbs and that when he was d/c'ed from the hospital recently he was 212 lbs "from all of the IVFs." He states that weight prior to this admission was in the 170 lb range. CBW recorded as pt's stated UBW; unsure if this weight was obtained on a scale or was simply a reported weight in the ED.   Medications reviewed; sliding scale Novolog, 10 mg IV Reglan x1 dose today, PRN Zofran, 80 mg IV Protonix x1 dose today, 8 mg/hr IV Protonix for 72 hours starting this AM. Labs reviewed; CBGs: 119-172 mg/dL, Ca: 7.9 mg/dL, Alk Phos elevated.   IVF: NS @ 125 mL/hr.      Diet Order:  Diet NPO time specified  Skin:  Reviewed, no issues  Last BM:  PTA/unknown  Height:   Ht Readings from Last 1 Encounters:  02/20/16 _0  (1.803 m)    Weight:   Wt Readings from Last 1 Encounters:  02/20/16 180 lb (81.6 kg)  Ideal Body Weight:  78.18 kg  BMI:  Body mass index is 25.1 kg/m.  Estimated Nutritional Needs:   Kcal:  2285-2530 (28-31 kcal/kg)  Protein:  98-122 grams (1.2-1.5 grams/kg)  Fluid:  >/= 2.3 L/day  EDUCATION NEEDS:   No education needs identified at this time    Jarome Matin, MS, RD, LDN, CNSC Inpatient Clinical Dietitian Pager # [phone] After hours/weekend pager # [phone]

## 2016-02-21 NOTE — Progress Notes (Signed)
PROGRESS NOTE    James Russell  [id number] DOB: [birth year] DOA: 02/20/2016 PCP: Pcp Not In System    Brief Narrative:  55 year old male with a history of colon cancer resected in the past but with metastases to the liver undergoing chemotherapy. He also has a history of esophageal varices and about a month ago had an upper GI bleed and underwent upper endoscopy with esophageal band ligation. He was to come back for repeat endoscopy the first week of December for further investigation and to see whether he need further esophageal banding. Last evening he had an episode of hematemesis and came to the hospital where he was subsequently admitted. He has not had any hematemesis in the past several hours. He has been started on an octreotide drip.  Assessment & Plan:   Principal Problem:   Hematemesis Active Problems:   Acute blood loss anemia   Thrombocytopenia (H[facility])   Metastatic colon cancer to liver [facility])   Peripheral neuropathy due to chemotherapy ([place])  1. Hematemesis:    - suspect secondary to variceal bleed - On octreotide and PPI gtt - On rocephin - GI consulted. Recommendations reviewed. Recs for EGD later today - Repeat CBC in AM  2. Acute blood loss anemia:  -Hgb 7.2 -follow serial h/h  3. Diabetes:  -Cont SSI   4. HTN:  -Hold ramipril and atenolol until hemodynamics clearer  5. Other medications:  -Hold Lyrica, oxycodone -Hold ambien, alprazolam -Hold atorvastatin  6. Thrombocytopenia:  Improved from discharge.  7. Metastatic colon cancer:  -Hold Lonsurf for now  DVT prophylaxis: SCD's Code Status: Full Family Communication: Pt in room, family at bedside Disposition Plan: Unceratin at this time  Consultants:   GI  Procedures:     Antimicrobials: Anti-infectives    Start     Dose/Rate Route Frequency Ordered Stop   02/22/16 0500  cefTRIAXone (ROCEPHIN) 1 g in dextrose 5 % 50 mL IVPB     1 g 100 mL/hr over 30 Minutes Intravenous Every  24 hours 02/21/16 0709     02/21/16 0430  cefTRIAXone (ROCEPHIN) 2 g in dextrose 5 % 50 mL IVPB     2 g 100 mL/hr over 30 Minutes Intravenous  Once 02/21/16 0427 02/21/16 0504       Subjective: No complaints  Objective: Vitals:   02/21/16 0550 02/21/16 0600 02/21/16 0741 02/21/16 1145  BP: 111/60  114/64   Pulse: 97 99 95   Resp: 20 18 15    Temp: 98.8 F (37.1 C)  98.5 F (36.9 C) 98.2 F (36.8 C)  TempSrc:   Oral Oral  SpO2: 100% 99% 98%   Weight:      Height:        Intake/Output Summary (Last 24 hours) at 02/21/16 1344 Last data filed at 02/21/16 1200  Gross per 24 hour  Intake          2077.08 ml  Output              300 ml  Net          1777.08 ml   Filed Weights   02/20/16 2328  Weight: 81.6 kg (180 lb)    Examination:  General exam: Appears calm and comfortable  Respiratory system: Clear to auscultation. Respiratory effort normal. Cardiovascular system: S1 & S2 heard, RRR. No JVD, murmurs, rubs, gallops or clicks. No pedal edema. Gastrointestinal system: Abdomen is nondistended, soft and nontender. No organomegaly or masses felt. Normal bowel sounds heard. Central nervous system:  Alert and oriented. No focal neurological deficits. Extremities: Symmetric 5 x 5 power. Skin: No rashes, lesions or ulcers Psychiatry: Judgement and insight appear normal. Mood & affect appropriate.   Data Reviewed: I have personally reviewed following labs and imaging studies  CBC:  Recent Labs Lab 02/21/16 0147 02/21/16 0959  WBC 7.3 3.8*  NEUTROABS 6.3  --   HGB 7.9* 7.2*  HCT 25.1* 23.0*  MCV 77.0* 78.8  PLT 110* 86*   Basic Metabolic Panel:  Recent Labs Lab 02/21/16 0147  NA 135  K 4.4  CL 109  CO2 22  GLUCOSE 138*  BUN 15  CREATININE 0.68  CALCIUM 7.9*   GFR: Estimated Creatinine Clearance: 108.5 mL/min (by C-G formula based on SCr of 0.68 mg/dL). Liver Function Tests:  Recent Labs Lab 02/21/16 0147  AST 32  ALT 22  ALKPHOS 142*  BILITOT  1.1  PROT 5.8*  ALBUMIN 2.8*    Recent Labs Lab 02/21/16 0147  LIPASE 46   No results for input(s): AMMONIA in the last 168 hours. Coagulation Profile:  Recent Labs Lab 02/21/16 0147  INR 1.27   Cardiac Enzymes: No results for input(s): CKTOTAL, CKMB, CKMBINDEX, TROPONINI in the last 168 hours. BNP (last 3 results) No results for input(s): PROBNP in the last 8760 hours. HbA1C: No results for input(s): HGBA1C in the last 72 hours. CBG:  Recent Labs Lab 02/21/16 0821  GLUCAP 133*   Lipid Profile: No results for input(s): CHOL, HDL, LDLCALC, TRIG, CHOLHDL, LDLDIRECT in the last 72 hours. Thyroid Function Tests: No results for input(s): TSH, T4TOTAL, FREET4, T3FREE, THYROIDAB in the last 72 hours. Anemia Panel: No results for input(s): VITAMINB12, FOLATE, FERRITIN, TIBC, IRON, RETICCTPCT in the last 72 hours. Sepsis Labs: No results for input(s): PROCALCITON, LATICACIDVEN in the last 168 hours.  Recent Results (from the past 240 hour(s))  MRSA PCR Screening     Status: None   Collection Time: 02/21/16  5:54 AM  Result Value Ref Range Status   MRSA by PCR NEGATIVE NEGATIVE Final    Comment:        The GeneXpert MRSA Assay (FDA approved for NASAL specimens only), is one component of a comprehensive MRSA colonization surveillance program. It is not intended to diagnose MRSA infection nor to guide or monitor treatment for MRSA infections.      Radiology Studies: No results found.  Scheduled Meds: . [START ON 02/22/2016] cefTRIAXone (ROCEPHIN)  IV  1 g Intravenous Q24H  . insulin aspart  0-9 Units Subcutaneous Q8H   Continuous Infusions: . sodium chloride 125 mL/hr at 02/21/16 0617  . octreotide  (SANDOSTATIN)    IV infusion 50 mcg/hr (02/21/16 0600)  . pantoprozole (PROTONIX) infusion 8 mg/hr (02/21/16 0600)     LOS: 0 days   Ryu Cerreta, Orpah Melter, MD [facility] Pager [phone]  If 7PM-7AM, please contact  night-coverage [email or web address] Password [email or web address] 02/21/2016, 1:44 PM

## 2016-02-21 NOTE — H&P (Signed)
History and Physical  Patient Name: James Russell     [id number]    DOB: [birth year]    DOA: 02/20/2016 PCP: Pcp Not In System  GI: Dr. Starr Sinclair Onc: Dr. Nelda Marse[place], [place]   Patient coming from: Home  Chief Complaint: Hematemesis  HPI: James Russell is a 55 y.o. 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  The patient was admitted to [facility] from 10/26 to 11/3 after developing nausea followed by vomiting bright red blood and presenting to the ER with hypotension and Hgb 7.  He had emergent EGD in the ER during which 3 varices were banded, he was given blood transfusion and treated with oral cipro empirically for SBP.  Since discharge, he has been back to his baseline.  He had a Hgb check two days ago with Hgb 10.5.  On Monday, he started a new chemotherapy agent Lonsurf.  Then tonight, he had mac and cheese for dinner and shortly after started to feel nauseated and then vomited a very large bright red bloody vomit.  He called EMS, and when they arrived, vomited 4 more times bright red blood.  ED course: -Temp 99.63F, heart rate 114, pulse ox and respirations normal, blood pressure 100/70 -Na 135, K 4.4, Cr 0.68, WBC 3.8K, Hgb 7.9, platelets 110 -Albumin 2.8, INR 1.2, LFTs normal -In the ER, his HR improved with fluids, he was started on octreotide and PPI gtt -He had more dark brown watery vomit, and GI was called and [facility] were asked to admit    The patient was first diagnosed with cancer in 2015, metastatic at that time to the liver and lungs. He had initial treatment with Xeloda and oxaliplatin and underwent right hemicolectomy in March 2016.  It appears that embolization of his liver lesions was contemplated, but they progressed faster than this could be arranged.           ROS: Review of Systems    Constitutional: Negative for chills and fever.  Gastrointestinal: Positive for abdominal pain, heartburn, nausea and vomiting (hematemesis). Negative for melena.  All other systems reviewed and are negative.         Past Medical History:  Diagnosis Date  . Cancer ([place])    liver  . H. pylori infection   . Hypercholesteremia   . Hypertension   . Iron deficiency   . MI (myocardial infarction)     Past Surgical History:  Procedure Laterality Date  . CORONARY ARTERY BYPASS GRAFT    . ESOPHAGOGASTRODUODENOSCOPY N/A 01/25/2016   Procedure: ESOPHAGOGASTRODUODENOSCOPY (EGD);  Surgeon: Teena Irani, MD;  Location: [facility];  Service: Endoscopy;  Laterality: N/A;  . implanted port      Social History: Patient lives with a roommate.  The patient walks unassisted.  He works as a Quarry manager at [employer].  He smokes.    Allergies  Allergen Reactions  . Tylenol [Acetaminophen]     Chills    Family history: family history includes Heart Problems in his father.  Prior to Admission medications   Medication Sig Start Date End Date Taking? Authorizing Provider  ALPRAZolam Duanne Moron) 0.5 MG tablet Take 0.5 mg by mouth at bedtime as needed for anxiety.   Yes Historical Provider, MD  aspirin 81 MG chewable tablet Chew 81 mg by mouth daily.   Yes Historical Provider, MD  atenolol (  TENORMIN) 100 MG tablet Take 50 mg by mouth daily.   Yes Historical Provider, MD  atorvastatin (LIPITOR) 80 MG tablet Take 80 mg by mouth daily.   Yes Historical Provider, MD  diclofenac sodium (VOLTAREN) 1 % GEL Apply 2 g topically 4 (four) times daily. To left groin 01/31/16  Yes Debbe Odea, MD  furosemide (LASIX) 40 MG tablet Take 1 tablet (40 mg total) by mouth 2 (two) times daily. 02/02/16  Yes Debbe Odea, MD  LONSURF 15-6.14 MG tablet Take 5 tablets by mouth 2 (two) times daily. 02/07/16  Yes Historical Provider, MD  nitroGLYCERIN (NITROSTAT) 0.4 MG SL tablet Place 0.4 mg under the tongue every 5 (five) minutes as  needed for chest pain.   Yes Historical Provider, MD  omeprazole (PRILOSEC) 40 MG capsule Take 40 mg by mouth daily.   Yes Historical Provider, MD  ondansetron (ZOFRAN-ODT) 8 MG disintegrating tablet Take 8 mg by mouth every 8 (eight) hours as needed for nausea or vomiting.   Yes Historical Provider, MD  oxyCODONE (OXY IR/ROXICODONE) 5 MG immediate release tablet Take 10 mg by mouth every 4 (four) hours as needed for severe pain.   Yes Historical Provider, MD  pregabalin (LYRICA) 150 MG capsule Take 1 capsule (150 mg total) by mouth 3 (three) times daily. 01/31/16  Yes Debbe Odea, MD  ramipril (ALTACE) 5 MG capsule Take 5 mg by mouth 2 (two) times daily.   Yes Historical Provider, MD  zolpidem (AMBIEN) 10 MG tablet Take 10 mg by mouth at bedtime as needed for sleep.   Yes Historical Provider, MD  ciprofloxacin (CIPRO) 500 MG tablet Take 1 tablet (500 mg total) by mouth 2 (two) times daily. One po bid x 7 days Patient not taking: Reported on 02/21/2016 02/02/16   Debbe Odea, MD  ketotifen (ZADITOR) 0.025 % ophthalmic solution Place 1 drop into both eyes 2 (two) times daily. Patient not taking: Reported on 02/21/2016 01/31/16   Debbe Odea, MD  potassium chloride 20 MEQ TBCR Take 20 mEq by mouth daily. Patient not taking: Reported on 02/21/2016 02/02/16   Debbe Odea, MD       Physical Exam: BP 114/71   Pulse 98   Temp 99.7 F (37.6 C) (Oral)   Resp 18   Ht 5\' 11"  (1.803 m)   Wt 81.6 kg (180 lb)   SpO2 100%   BMI 25.10 kg/m  General appearance: Well-developed, adult male, alert and in mild distress from nausea.   Eyes: Anicteric, conjunctiva pale, lids and lashes normal. PERRL.    ENT: No nasal deformity, discharge, epistaxis.  Hearing normal. OP moist without lesions.  After vomiting, he has bright red blood on the lips. Neck: No neck masses.  Trachea midline.  No thyromegaly/tenderness. Lymph: No cervical or supraclavicular lymphadenopathy. Skin: Warm and dry.  Wan complexion.  No  jaundice.  No suspicious rashes or lesions. Cardiac: Tachycardic, regular, nl S1-S2, no murmurs appreciated.   Respiratory: Normal respiratory rate and rhythm.  CTAB without rales or wheezes. Abdomen: Abdomen soft.  Mild epigastric TTP without guarding. No ascites, distension, hepatosplenomegaly.   MSK: No deformities or effusions.  No cyanosis or clubbing. Neuro: Cranial nerves normal.  Sensation intact to light touch. Speech is fluent.  Muscle strength normal.    Psych: Sensorium intact and responding to questions, attention normal.  Behavior appropriate.  Affect normal.  Judgment and insight appear normal.     Labs on Admission:  I have personally reviewed following labs and imaging  studies: CBC:  Recent Labs Lab 02/21/16 0147  WBC 7.3  NEUTROABS 6.3  HGB 7.9*  HCT 25.1*  MCV 77.0*  PLT [id number]*   Basic Metabolic Panel:  Recent Labs Lab 02/21/16 0147  NA 135  K 4.4  CL 109  CO2 22  GLUCOSE 138*  BUN 15  CREATININE 0.68  CALCIUM 7.9*   GFR: Estimated Creatinine Clearance: 108.5 mL/min (by C-G formula based on SCr of 0.68 mg/dL).  Liver Function Tests:  Recent Labs Lab 02/21/16 0147  AST 32  ALT 22  ALKPHOS 142*  BILITOT 1.1  PROT 5.8*  ALBUMIN 2.8*    Recent Labs Lab 02/21/16 0147  LIPASE 46   No results for input(s): AMMONIA in the last 168 hours. Coagulation Profile:  Recent Labs Lab 02/21/16 0147  INR 1.27   Cardiac Enzymes: No results for input(s): CKTOTAL, CKMB, CKMBINDEX, TROPONINI in the last 168 hours. BNP (last 3 results) No results for input(s): PROBNP in the last 8760 hours. HbA1C: No results for input(s): HGBA1C in the last 72 hours. CBG: No results for input(s): GLUCAP in the last 168 hours. Lipid Profile: No results for input(s): CHOL, HDL, LDLCALC, TRIG, CHOLHDL, LDLDIRECT in the last 72 hours. Thyroid Function Tests: No results for input(s): TSH, T4TOTAL, FREET4, T3FREE, THYROIDAB in the last 72 hours. Anemia Panel: No  results for input(s): VITAMINB12, FOLATE, FERRITIN, TIBC, IRON, RETICCTPCT in the last 72 hours. Sepsis Labs: Invalid input(s): PROCALCITONIN, LACTICIDVEN No results found for this or any previous visit (from the past 240 hour(s)).       EKG: Independently reviewed. Rate 106, QTc 429, sinus tachycardia.  EGD from 01/26/16: Varices were banded x3.  There was incomplete obliteration.       Assessment/Plan Principal Problem:   Hematemesis Active Problems:   Acute blood loss anemia   Thrombocytopenia (HCC)   Metastatic colon cancer to liver [facility])   Peripheral neuropathy due to chemotherapy ([place])  1. Hematemesis:    Recent large variceal bleed. -Octreotide and PPI gtt -Ceftriaxone daily -Consult to GI, appreciate cares -NPO and MIVF -Monitor in stepdown -Serial CBC -Ondansetron for nausea   2. Acute blood loss anemia:  -Trend Hgb q8hrs  -Transfuse for Hgb < 7-8 g/dL  3. Diabetes:  Chart history of diabetes.  Not currently on medication. -Low dose SSI q8  4. HTN:  -Hold ramipril and atenolol until hemodynamics clearer  5. Other medications:  -Hold Lyrica, oxycodone -Hold ambien, alprazolam -Hold atorvastatin  6. Thrombocytopenia:  Improved from discharge.  7. Metastatic colon cancer:  -Hold Lonsurf for now     DVT prophylaxis: SCDs  Code Status: FULL  Family Communication: None present  Disposition Plan: Anticipate GI consultation and EGD with possible banding today.  Octreotide and PPI and trend H/H Consults called: [facility], will see this morning Admission status: INPATIENT, stepdown         Medical decision making: Patient seen at 5:00 AM on 02/21/2016.  The patient was discussed with Dr. Claudine Mouton.  What exists of the patient's chart was reviewed in depth and outside recrods in [place] and summarized above.  Clinical condition: unstable.        Edwin Dada [facility] Pager [phone]

## 2016-02-21 NOTE — Care Management Note (Signed)
Case Management Note  Patient Details  Name: James Russell MRN: [id number] Date of Birth: [birth year]  Subjective/Objective:       Variceal bleed             Action/Plan:  Lives at home alone Date:  February 21, 2016 Chart reviewed for concurrent status and case management needs. Will continue to follow patient progress. Discharge Planning: following for needs Expected discharge date: [id number] Velva Harman, BSN, [place], [place]   Expected Discharge Date:                  Expected Discharge Plan:  Home/Self Care  In-House Referral:     Discharge planning Services     Post Acute Care Choice:    Choice offered to:     DME Arranged:    DME Agency:     HH Arranged:    [place] Agency:     Status of Service:  In process, will continue to follow  If discussed at Long Length of Stay Meetings, dates discussed:    Additional Comments:  Leeroy Cha, RN 02/21/2016, 10:35 AM

## 2016-02-22 DIAGNOSIS — E440 Moderate protein-calorie malnutrition: Secondary | ICD-10-CM | POA: Insufficient documentation

## 2016-02-22 LAB — CBC
HCT: 25.4 % — ABNORMAL LOW (ref 39.0–52.0)
HCT: 24.9 % — ABNORMAL LOW (ref 39.0–52.0)
HEMATOCRIT: 28.7 % — AB (ref 39.0–52.0)
HEMOGLOBIN: 8.1 g/dL — AB (ref 13.0–17.0)
HEMOGLOBIN: 8.2 g/dL — AB (ref 13.0–17.0)
Hemoglobin: 9.2 g/dL — ABNORMAL LOW (ref 13.0–17.0)
MCH: 25.6 pg — ABNORMAL LOW (ref 26.0–34.0)
MCH: 25.9 pg — AB (ref 26.0–34.0)
MCH: 25.6 pg — ABNORMAL LOW (ref 26.0–34.0)
MCHC: 31.9 g/dL (ref 30.0–36.0)
MCHC: 32.9 g/dL (ref 30.0–36.0)
MCHC: 32.1 g/dL (ref 30.0–36.0)
MCV: 80.1 fL (ref 78.0–100.0)
MCV: 78.5 fL (ref 78.0–100.0)
MCV: 79.7 fL (ref 78.0–100.0)
PLATELETS: 106 10*3/uL — AB (ref 150–400)
Platelets: 78 10*3/uL — ABNORMAL LOW (ref 150–400)
Platelets: 89 10*3/uL — ABNORMAL LOW (ref 150–400)
RBC: 3.17 MIL/uL — AB (ref 4.22–5.81)
RBC: 3.17 MIL/uL — AB (ref 4.22–5.81)
RBC: 3.6 MIL/uL — ABNORMAL LOW (ref 4.22–5.81)
RDW: 17.9 % — ABNORMAL HIGH (ref 11.5–15.5)
RDW: 17.6 % — ABNORMAL HIGH (ref 11.5–15.5)
RDW: 17.8 % — AB (ref 11.5–15.5)
WBC: 3.6 10*3/uL — AB (ref 4.0–10.5)
WBC: 3.7 10*3/uL — ABNORMAL LOW (ref 4.0–10.5)
WBC: 4.7 10*3/uL (ref 4.0–10.5)

## 2016-02-22 LAB — GLUCOSE, CAPILLARY
GLUCOSE-CAPILLARY: 95 mg/dL (ref 65–99)
GLUCOSE-CAPILLARY: 204 mg/dL — AB (ref 65–99)
Glucose-Capillary: 165 mg/dL — ABNORMAL HIGH (ref 65–99)

## 2016-02-22 LAB — PREPARE RBC (CROSSMATCH)

## 2016-02-22 MED ORDER — SODIUM CHLORIDE 0.9% FLUSH
10.0000 mL | INTRAVENOUS | Status: DC | PRN
  Administered 2016-02-22 – 2016-02-26 (×2): 10 mL
  Filled 2016-02-22 (×2): qty 40

## 2016-02-22 MED ORDER — FUROSEMIDE 40 MG PO TABS
40.0000 mg | ORAL_TABLET | Freq: Two times a day (BID) | ORAL | Status: DC
  Administered 2016-02-22 – 2016-02-26 (×7): 40 mg via ORAL
  Filled 2016-02-22: qty 2
  Filled 2016-02-22 (×2): qty 1
  Filled 2016-02-22: qty 2
  Filled 2016-02-22: qty 1
  Filled 2016-02-22: qty 2
  Filled 2016-02-22 (×2): qty 1

## 2016-02-22 MED ORDER — OXYCODONE HCL 5 MG PO TABS
10.0000 mg | ORAL_TABLET | ORAL | Status: DC | PRN
  Administered 2016-02-22 – 2016-02-26 (×3): 10 mg via ORAL
  Filled 2016-02-22 (×3): qty 2

## 2016-02-22 MED ORDER — SODIUM CHLORIDE 0.9% FLUSH
10.0000 mL | Freq: Two times a day (BID) | INTRAVENOUS | Status: DC
  Administered 2016-02-23 – 2016-02-26 (×3): 10 mL

## 2016-02-22 MED ORDER — ALPRAZOLAM 0.5 MG PO TABS: 0.5000 mg | ORAL_TABLET | Freq: Every evening | ORAL | Status: DC | PRN

## 2016-02-22 MED ORDER — SODIUM CHLORIDE 0.9 % IV SOLN
Freq: Once | INTRAVENOUS | Status: AC
  Administered 2016-02-22: 02:00:00 via INTRAVENOUS

## 2016-02-22 MED ORDER — ATORVASTATIN CALCIUM 40 MG PO TABS
80.0000 mg | ORAL_TABLET | Freq: Every day | ORAL | Status: DC
  Administered 2016-02-22 – 2016-02-25 (×3): 80 mg via ORAL
  Filled 2016-02-22 (×4): qty 2

## 2016-02-22 MED ORDER — DEXTROSE 5 % IV SOLN
2.0000 g | INTRAVENOUS | Status: DC
  Administered 2016-02-23 – 2016-02-26 (×4): 2 g via INTRAVENOUS
  Filled 2016-02-22 (×4): qty 2

## 2016-02-22 NOTE — Progress Notes (Signed)
PROGRESS NOTE    James Russell  [id number] DOB: 11-08-1960 DOA: 02/20/2016 PCP: Pcp Not In System    Brief Narrative:  55 year old male with a history of colon cancer resected in the past but with metastases to the liver undergoing chemotherapy. He also has a history of esophageal varices and about a month ago had an upper GI bleed and underwent upper endoscopy with esophageal band ligation. He was to come back for repeat endoscopy the first week of December for further investigation and to see whether he need further esophageal banding. Last evening he had an episode of hematemesis and came to the hospital where he was subsequently admitted. He has not had any hematemesis in the past several hours. He has been started on an octreotide drip.  Assessment & Plan:   Principal Problem:   Hematemesis Active Problems:   Acute blood loss anemia   Thrombocytopenia (HCC)   Metastatic colon cancer to liver [facility])   Peripheral neuropathy due to chemotherapy (HCC)   Malnutrition of moderate degree  1. Hematemesis:    - suspect secondary to variceal bleed - Patient is continued on octreotide and PPI gtt for another 24 hours per GI recommendations - On empiric rocephin - Patient is now status post EGD on 02/21/2016 with findings of grade 1-2 soft Jill varices that are likely the source of bleeding, however the varices are too small to band at the present time. There is a small hiatal hernia with portal hypertensive gastropathy. -Patient advised to keep his outpatient EGD appointment in early December. - Will repeat CBC in AM  2. Acute blood loss anemia:  -Hgb stable trended down to 6.7 overnight. -Patient given 1 unit of packed blood blood cells with posttransfusion hemoglobin of 8.1, repeat hemoglobin stable at 8.2. Labs reviewed -Peak CBC in the morning  3. Diabetes:  -Cont SSI   4. HTN:  -Holding blood pressure regimen for now  5. Other medications:  -Plan to resume Lyrica,  oxycodone -We'll resume when necessary alprazolam -We'll resume atorvastatin  6. Thrombocytopenia:  Remains less than 100,000, but is improving. Labs reviewed  7. Metastatic colon cancer:  -Hold Lonsurf for now  DVT prophylaxis: SCD's Code Status: Full Family Communication: Pt in room, family at bedside Disposition Plan: Unceratin at this time  Consultants:   GI  Procedures:     Antimicrobials: Anti-infectives    Start     Dose/Rate Route Frequency Ordered Stop   02/23/16 0500  cefTRIAXone (ROCEPHIN) 2 g in dextrose 5 % 50 mL IVPB     2 g 100 mL/hr over 30 Minutes Intravenous Every 24 hours 02/22/16 0912     02/22/16 0500  cefTRIAXone (ROCEPHIN) 1 g in dextrose 5 % 50 mL IVPB  Status:  Discontinued     1 g 100 mL/hr over 30 Minutes Intravenous Every 24 hours 02/21/16 0709 02/22/16 0912   02/21/16 0430  cefTRIAXone (ROCEPHIN) 2 g in dextrose 5 % 50 mL IVPB     2 g 100 mL/hr over 30 Minutes Intravenous  Once 02/21/16 0427 02/21/16 0504      Subjective: Without complaints today  Objective: Vitals:   02/22/16 1500 02/22/16 1600 02/22/16 1656 02/22/16 1721  BP:   (!) 148/81 (!) 148/83  Pulse: 75  80 82  Resp: 12  16   Temp:  98 F (36.7 C)  99.3 F (37.4 C)  TempSrc:  Oral  Oral  SpO2: 100%  99% 100%  Weight:  Height:        Intake/Output Summary (Last 24 hours) at 02/22/16 1904 Last data filed at 02/22/16 1500  Gross per 24 hour  Intake             4335 ml  Output              550 ml  Net             3785 ml   Filed Weights   02/20/16 2328  Weight: 81.6 kg (180 lb)    Examination:  General exam: Lying in bed, no apparent distress Respiratory system: Normal respiratory effort, no audible wheezing Cardiovascular system: Regular rate, S1-S2 Gastrointestinal system: Soft, positive bowel sounds Central nervous system: CN 2-12 grossly intact, sensation intact throughout Extremities: Perfused, no clubbing Skin: Normal skin turgor, no notable skin  lesions seen Psychiatry: Mood normal, no visual hallucinations   Data Reviewed: I have personally reviewed following labs and imaging studies  CBC:  Recent Labs Lab 02/21/16 0147 02/21/16 0959 02/21/16 2313 02/22/16 0801 02/22/16 1227  WBC 7.3 3.8* 3.2* 3.6* 3.7*  NEUTROABS 6.3  --   --   --   --   HGB 7.9* 7.2* 6.7* 8.1* 8.2*  HCT 25.1* 23.0* 21.2* 25.4* 24.9*  MCV 77.0* 78.8 77.9* 80.1 78.5  PLT 110* 86* 87* 78* 89*   Basic Metabolic Panel:  Recent Labs Lab 02/21/16 0147  NA 135  K 4.4  CL 109  CO2 22  GLUCOSE 138*  BUN 15  CREATININE 0.68  CALCIUM 7.9*   GFR: Estimated Creatinine Clearance: 108.5 mL/min (by C-G formula based on SCr of 0.68 mg/dL). Liver Function Tests:  Recent Labs Lab 02/21/16 0147  AST 32  ALT 22  ALKPHOS 142*  BILITOT 1.1  PROT 5.8*  ALBUMIN 2.8*    Recent Labs Lab 02/21/16 0147  LIPASE 46   No results for input(s): AMMONIA in the last 168 hours. Coagulation Profile:  Recent Labs Lab 02/21/16 0147  INR 1.27   Cardiac Enzymes: No results for input(s): CKTOTAL, CKMB, CKMBINDEX, TROPONINI in the last 168 hours. BNP (last 3 results) No results for input(s): PROBNP in the last 8760 hours. HbA1C: No results for input(s): HGBA1C in the last 72 hours. CBG:  Recent Labs Lab 02/21/16 1602 02/21/16 1945 02/22/16 0755 02/22/16 1226 02/22/16 1652  GLUCAP 137* 107* 95 165* 204*   Lipid Profile: No results for input(s): CHOL, HDL, LDLCALC, TRIG, CHOLHDL, LDLDIRECT in the last 72 hours. Thyroid Function Tests: No results for input(s): TSH, T4TOTAL, FREET4, T3FREE, THYROIDAB in the last 72 hours. Anemia Panel: No results for input(s): VITAMINB12, FOLATE, FERRITIN, TIBC, IRON, RETICCTPCT in the last 72 hours. Sepsis Labs: No results for input(s): PROCALCITON, LATICACIDVEN in the last 168 hours.  Recent Results (from the past 240 hour(s))  MRSA PCR Screening     Status: None   Collection Time: 02/21/16  5:54 AM  Result  Value Ref Range Status   MRSA by PCR NEGATIVE NEGATIVE Final    Comment:        The GeneXpert MRSA Assay (FDA approved for NASAL specimens only), is one component of a comprehensive MRSA colonization surveillance program. It is not intended to diagnose MRSA infection nor to guide or monitor treatment for MRSA infections.      Radiology Studies: No results found.  Scheduled Meds: . atorvastatin  80 mg Oral Daily  . [START ON 02/23/2016] cefTRIAXone (ROCEPHIN)  IV  2 g Intravenous Q24H  . furosemide  40 mg Oral BID  . insulin aspart  0-9 Units Subcutaneous Q8H   Continuous Infusions: . sodium chloride 125 mL/hr at 02/21/16 1946  . octreotide  (SANDOSTATIN)    IV infusion 50 mcg/hr (02/22/16 1500)  . pantoprozole (PROTONIX) infusion 8 mg/hr (02/22/16 1500)     LOS: 1 day   CHIU, Orpah Melter, MD [facility] Pager [phone]  If 7PM-7AM, please contact night-coverage [email or web address] Password [facility] 02/22/2016, 7:04 PM

## 2016-02-22 NOTE — Progress Notes (Signed)
Subjective: Improving epigastric soreness. No hematemesis x 24 hours.  Objective: Vital signs in last 24 hours: Temp:  [97.8 F (36.6 C)-98.7 F (37.1 C)] 98.5 F (36.9 C) (11/23 0800) Pulse Rate:  [72-100] 82 (11/23 0800) Resp:  [12-23] 13 (11/23 0800) BP: (73-137)/(40-80) 117/63 (11/23 0800) SpO2:  [96 %-100 %] 99 % (11/23 0800) Weight change:  Last BM Date: 02/21/16  PE: GEN:  NAD ABD:  Soft, mild epigastric tenderness, no peritonitis  Lab Results: CBC    Component Value Date/Time   WBC 3.6 (L) 02/22/2016 0801   RBC 3.17 (L) 02/22/2016 0801   HGB 8.1 (L) 02/22/2016 0801   HCT 25.4 (L) 02/22/2016 0801   PLT 78 (L) 02/22/2016 0801   MCV 80.1 02/22/2016 0801   MCH 25.6 (L) 02/22/2016 0801   MCHC 31.9 02/22/2016 0801   RDW 17.9 (H) 02/22/2016 0801   LYMPHSABS 0.5 (L) 02/21/2016 0147   MONOABS 0.5 02/21/2016 0147   EOSABS 0.0 02/21/2016 0147   BASOSABS 0.0 02/21/2016 0147   CMP     Component Value Date/Time   NA 135 02/21/2016 0147   K 4.4 02/21/2016 0147   CL 109 02/21/2016 0147   CO2 22 02/21/2016 0147   GLUCOSE 138 (H) 02/21/2016 0147   BUN 15 02/21/2016 0147   CREATININE 0.68 02/21/2016 0147   CALCIUM 7.9 (L) 02/21/2016 0147   PROT 5.8 (L) 02/21/2016 0147   ALBUMIN 2.8 (L) 02/21/2016 0147   AST 32 02/21/2016 0147   ALT 22 02/21/2016 0147   ALKPHOS 142 (H) 02/21/2016 0147   BILITOT 1.1 02/21/2016 0147   GFRNONAA >60 02/21/2016 0147   GFRAA >60 02/21/2016 0147   Assessment:  1.  Acute blood loss anemia. 2.  Hematemesis, resolved, suspect esophageal variceal source. 3.  Metastatic colon cancer.  Plan:  1.  Octreotide and Protonix drips for another 24 hours. 2.  Advance to full liquid diet. 3.  Possibly home in next 1-2 days.   4.  I advised patient to keep his outpatient EGD appointment in early December, as his varices this time with volume depletion and octreotide shrank down too small to band, but the nipple seen in mid esophagus and red wale  signs strongly argue in favor of variceal etiology, and thus when returning for EGD electively when euvolemic I suspect the varices will be large enough to band at that time. 5.  OK to transition out of ICU/Stepdown from GI perspective, so long as patient can continue to receive Protonix and Octreotide drips. 6.  [facility] will follow.  [name] 02/22/2016, 10:32 AM   Pager [phone] If no answer or after 5 PM call [phone]

## 2016-02-23 ENCOUNTER — Encounter (HOSPITAL_COMMUNITY): Payer: Self-pay | Admitting: *Deleted

## 2016-02-23 ENCOUNTER — Encounter (HOSPITAL_COMMUNITY)
Admission: EM | Disposition: A | Discharge status: Home / Self Care | Payer: Self-pay | Source: Home / Self Care | Attending: Internal Medicine

## 2016-02-23 ENCOUNTER — Inpatient Hospital Stay (HOSPITAL_COMMUNITY): Payer: PRIVATE HEALTH INSURANCE

## 2016-02-23 DIAGNOSIS — I8501 Esophageal varices with bleeding: Secondary | ICD-10-CM

## 2016-02-23 HISTORY — PX: ESOPHAGOGASTRODUODENOSCOPY: SHX5428

## 2016-02-23 LAB — CBC WITH DIFFERENTIAL/PLATELET
BASOS ABS: 0 10*3/uL (ref 0.0–0.1)
BASOS PCT: 0 %
Eosinophils Absolute: 0.2 10*3/uL (ref 0.0–0.7)
Eosinophils Relative: 3 %
HEMATOCRIT: 30.3 % — AB (ref 39.0–52.0)
Hemoglobin: 9.5 g/dL — ABNORMAL LOW (ref 13.0–17.0)
LYMPHS PCT: 10 %
Lymphs Abs: 0.8 10*3/uL (ref 0.7–4.0)
MCH: 24.9 pg — ABNORMAL LOW (ref 26.0–34.0)
MCHC: 31.4 g/dL (ref 30.0–36.0)
MCV: 79.3 fL (ref 78.0–100.0)
MONO ABS: 0.7 10*3/uL (ref 0.1–1.0)
Monocytes Relative: 9 %
NEUTROS ABS: 5.8 10*3/uL (ref 1.7–7.7)
Neutrophils Relative %: 78 %
PLATELETS: 182 10*3/uL (ref 150–400)
RBC: 3.82 MIL/uL — AB (ref 4.22–5.81)
RDW: 18.1 % — AB (ref 11.5–15.5)
WBC: 7.5 10*3/uL (ref 4.0–10.5)

## 2016-02-23 LAB — GLUCOSE, CAPILLARY
GLUCOSE-CAPILLARY: 162 mg/dL — AB (ref 65–99)
Glucose-Capillary: 132 mg/dL — ABNORMAL HIGH (ref 65–99)

## 2016-02-23 LAB — HEMOGLOBIN AND HEMATOCRIT, BLOOD
HEMATOCRIT: 30.2 % — AB (ref 39.0–52.0)
Hemoglobin: 9.4 g/dL — ABNORMAL LOW (ref 13.0–17.0)

## 2016-02-23 SURGERY — EGD (ESOPHAGOGASTRODUODENOSCOPY)
Anesthesia: Moderate Sedation | Laterality: Left

## 2016-02-23 MED ORDER — PANTOPRAZOLE SODIUM 40 MG PO TBEC
40.0000 mg | DELAYED_RELEASE_TABLET | Freq: Every day | ORAL | Status: DC
  Administered 2016-02-23 – 2016-02-26 (×4): 40 mg via ORAL
  Filled 2016-02-23 (×4): qty 1

## 2016-02-23 MED ORDER — DEXTROSE 5 % IV SOLN
1.0000 g | INTRAVENOUS | Status: DC
  Administered 2016-02-23: 1 g via INTRAVENOUS
  Filled 2016-02-23 (×2): qty 10

## 2016-02-23 MED ORDER — DIPHENHYDRAMINE HCL 50 MG/ML IJ SOLN
INTRAMUSCULAR | Status: AC
  Filled 2016-02-23: qty 1

## 2016-02-23 MED ORDER — MIDAZOLAM HCL 10 MG/2ML IJ SOLN
INTRAMUSCULAR | Status: DC | PRN
  Administered 2016-02-23: 2 mg via INTRAVENOUS
  Administered 2016-02-23: 1 mg via INTRAVENOUS
  Administered 2016-02-23: 2 mg via INTRAVENOUS

## 2016-02-23 MED ORDER — FENTANYL CITRATE (PF) 100 MCG/2ML IJ SOLN
INTRAMUSCULAR | Status: DC | PRN
  Administered 2016-02-23 (×3): 25 ug via INTRAVENOUS

## 2016-02-23 MED ORDER — SODIUM CHLORIDE 0.9 % IV SOLN: INTRAVENOUS | Status: DC

## 2016-02-23 MED ORDER — DIPHENHYDRAMINE HCL 50 MG/ML IJ SOLN
INTRAMUSCULAR | Status: DC | PRN
  Administered 2016-02-23: 12.5 mg via INTRAVENOUS

## 2016-02-23 MED ORDER — MAGNESIUM CITRATE PO SOLN
1.0000 | Freq: Once | ORAL | Status: AC
  Administered 2016-02-23: 1 via ORAL

## 2016-02-23 MED ORDER — KETOTIFEN FUMARATE 0.025 % OP SOLN
1.0000 [drp] | Freq: Two times a day (BID) | OPHTHALMIC | Status: DC
  Administered 2016-02-23 – 2016-02-26 (×7): 1 [drp] via OPHTHALMIC
  Filled 2016-02-23: qty 5

## 2016-02-23 MED ORDER — CIPROFLOXACIN IN D5W 400 MG/200ML IV SOLN
400.0000 mg | Freq: Once | INTRAVENOUS | Status: AC
  Administered 2016-02-24: 400 mg via INTRAVENOUS
  Filled 2016-02-23 (×2): qty 200

## 2016-02-23 MED ORDER — BUTAMBEN-TETRACAINE-BENZOCAINE 2-2-14 % EX AERO
INHALATION_SPRAY | CUTANEOUS | Status: DC | PRN
  Administered 2016-02-23: 1 via TOPICAL

## 2016-02-23 MED ORDER — FENTANYL CITRATE (PF) 100 MCG/2ML IJ SOLN
INTRAMUSCULAR | Status: AC
  Filled 2016-02-23: qty 2

## 2016-02-23 MED ORDER — PROCHLORPERAZINE EDISYLATE 5 MG/ML IJ SOLN
10.0000 mg | INTRAMUSCULAR | Status: DC | PRN
  Administered 2016-02-23: 10 mg via INTRAVENOUS
  Filled 2016-02-23: qty 2

## 2016-02-23 MED ORDER — MIDAZOLAM HCL 5 MG/ML IJ SOLN
INTRAMUSCULAR | Status: AC
  Filled 2016-02-23: qty 2

## 2016-02-23 NOTE — Progress Notes (Signed)
PROGRESS NOTE    James Russell  [id number] DOB: Feb 26, 1961 DOA: 02/20/2016 PCP: Pcp Not In System    Brief Narrative:  55 year old male with a history of colon cancer resected in the past but with metastases to the liver undergoing chemotherapy. He also has a history of esophageal varices and about a month ago had an upper GI bleed and underwent upper endoscopy with esophageal band ligation. He was to come back for repeat endoscopy the first week of December for further investigation and to see whether he need further esophageal banding. Last evening he had an episode of hematemesis and came to the hospital where he was subsequently admitted. He has not had any hematemesis in the past several hours. He has been started on an octreotide drip.  Assessment & Plan:   Principal Problem:   Hematemesis Active Problems:   Acute blood loss anemia   Thrombocytopenia (HCC)   Metastatic colon cancer to liver [facility])   Peripheral neuropathy due to chemotherapy (HCC)   Malnutrition of moderate degree  1. Hematemesis:    - suspect secondary to variceal bleed - Patient is continued on octreotide and PPI gtt - Patient is now status post EGD on 02/21/2016 with findings of grade 1-2 soft Jill varices that are likely the source of bleeding, however the varices are too small to band at the present time. There is a small hiatal hernia with portal hypertensive gastropathy. -This afternoon, patient noted to have hemoptysis with nausea/vomiting. Have discussed case with GI who recommends transfer to SDU, plan for emergent endoscopy -Will keep NPO -repeat H/H was ordered earlier, pending still  2. Acute blood loss anemia:  -Patient given 1 unit of packed blood blood cells with posttransfusion hemoglobin of 8.1, repeat hemoglobin stable at 8.2. Labs reviewed -likely secondary to variceal bleed -plan per above  3. Diabetes:  -Cont SSI  -Random glucose had been stable  4. HTN:  -Holding blood  pressure regimen for now -blood pressure presently stable and controlled  5. Other medications:  -Had resumed Lyrica, oxycodone -continued on necessary alprazolam -Had continued  atorvastatin  6. Thrombocytopenia:  -gradually improving -Platelets today over 100k -Repeat CBC in AM  7. Metastatic colon cancer:  -Hold Lonsurf for now -Appears stable  8. Constipation -xray of abdomen ordered, reviewed -Findings of moderate stool burden -Given trial of mg citrate without effect  DVT prophylaxis: SCD's Code Status: Full Family Communication: Pt in room, family at bedside Disposition Plan: Unceratin at this time  Consultants:   GI  Procedures:     Antimicrobials: Anti-infectives    Start     Dose/Rate Route Frequency Ordered Stop   02/23/16 0500  cefTRIAXone (ROCEPHIN) 2 g in dextrose 5 % 50 mL IVPB     2 g 100 mL/hr over 30 Minutes Intravenous Every 24 hours 02/22/16 0912     02/22/16 0500  cefTRIAXone (ROCEPHIN) 1 g in dextrose 5 % 50 mL IVPB  Status:  Discontinued     1 g 100 mL/hr over 30 Minutes Intravenous Every 24 hours 02/21/16 0709 02/22/16 0912   02/21/16 0430  cefTRIAXone (ROCEPHIN) 2 g in dextrose 5 % 50 mL IVPB     2 g 100 mL/hr over 30 Minutes Intravenous  Once 02/21/16 0427 02/21/16 0504      Subjective: Reports coughing up blood  Objective: Vitals:   02/22/16 1721 02/22/16 2142 02/23/16 0545 02/23/16 1400  BP: (!) 148/83 131/72 120/60 115/69  Pulse: 82 73 69 84  Resp:  16 16 16   Temp: 99.3 F (37.4 C) 98.4 F (36.9 C) 98.5 F (36.9 C) 98.5 F (36.9 C)  TempSrc: Oral Oral Oral Oral  SpO2: 100% 100% 100% 99%  Weight:      Height:        Intake/Output Summary (Last 24 hours) at 02/23/16 1825 Last data filed at 02/23/16 1800  Gross per 24 hour  Intake          5616.33 ml  Output              725 ml  Net          4891.33 ml   Filed Weights   02/20/16 2328  Weight: 81.6 kg (180 lb)    Examination:  General exam: Laying in bed,  in nad Respiratory system: normal chest rise, no audible wheezing Cardiovascular system: regular rhythm, auscultated s1, s2 Gastrointestinal system: distended, decreased BS, generally tender Central nervous system: no tremors, no seizures Extremities: no cyanosis, no joint deformities Skin: No rashes, no pallor Psychiatry: affect normal, no auditory hallucinations   Data Reviewed: I have personally reviewed following labs and imaging studies  CBC:  Recent Labs Lab 02/21/16 0147 02/21/16 0959 02/21/16 2313 02/22/16 0801 02/22/16 1227 02/22/16 2025 02/23/16 1748  WBC 7.3 3.8* 3.2* 3.6* 3.7* 4.7  --   NEUTROABS 6.3  --   --   --   --   --   --   HGB 7.9* 7.2* 6.7* 8.1* 8.2* 9.2* 9.4*  HCT 25.1* 23.0* 21.2* 25.4* 24.9* 28.7* 30.2*  MCV 77.0* 78.8 77.9* 80.1 78.5 79.7  --   PLT 110* 86* 87* 78* 89* 106*  --    Basic Metabolic Panel:  Recent Labs Lab 02/21/16 0147  NA 135  K 4.4  CL 109  CO2 22  GLUCOSE 138*  BUN 15  CREATININE 0.68  CALCIUM 7.9*   GFR: Estimated Creatinine Clearance: 108.5 mL/min (by C-G formula based on SCr of 0.68 mg/dL). Liver Function Tests:  Recent Labs Lab 02/21/16 0147  AST 32  ALT 22  ALKPHOS 142*  BILITOT 1.1  PROT 5.8*  ALBUMIN 2.8*    Recent Labs Lab 02/21/16 0147  LIPASE 46   No results for input(s): AMMONIA in the last 168 hours. Coagulation Profile:  Recent Labs Lab 02/21/16 0147  INR 1.27   Cardiac Enzymes: No results for input(s): CKTOTAL, CKMB, CKMBINDEX, TROPONINI in the last 168 hours. BNP (last 3 results) No results for input(s): PROBNP in the last 8760 hours. HbA1C: No results for input(s): HGBA1C in the last 72 hours. CBG:  Recent Labs Lab 02/22/16 0755 02/22/16 1226 02/22/16 1652 02/23/16 0745 02/23/16 1554  GLUCAP 95 165* 204* 132* 162*   Lipid Profile: No results for input(s): CHOL, HDL, LDLCALC, TRIG, CHOLHDL, LDLDIRECT in the last 72 hours. Thyroid Function Tests: No results for input(s):  TSH, T4TOTAL, FREET4, T3FREE, THYROIDAB in the last 72 hours. Anemia Panel: No results for input(s): VITAMINB12, FOLATE, FERRITIN, TIBC, IRON, RETICCTPCT in the last 72 hours. Sepsis Labs: No results for input(s): PROCALCITON, LATICACIDVEN in the last 168 hours.  Recent Results (from the past 240 hour(s))  MRSA PCR Screening     Status: None   Collection Time: 02/21/16  5:54 AM  Result Value Ref Range Status   MRSA by PCR NEGATIVE NEGATIVE Final    Comment:        The GeneXpert MRSA Assay (FDA approved for NASAL specimens only), is one component of a comprehensive MRSA  colonization surveillance program. It is not intended to diagnose MRSA infection nor to guide or monitor treatment for MRSA infections.      Radiology Studies: Dg Abd Portable 1v  Result Date: 02/23/2016 CLINICAL DATA:  Abdominal pain nausea and vomiting EXAM: PORTABLE ABDOMEN - 1 VIEW COMPARISON:  CT abdomen and pelvis April 28, 2013 FINDINGS: There is moderate stool throughout colon. There is no bowel dilatation or air-fluid level suggesting bowel obstruction. No free air. There is postoperative change in the right abdomen. There is degenerative change in the lumbar spine. IMPRESSION: No bowel obstruction or free air. Electronically Signed   By: Lowella Grip III M.D.   On: 02/23/2016 12:32    Scheduled Meds: . atorvastatin  80 mg Oral Daily  . cefTRIAXone (ROCEPHIN)  IV  2 g Intravenous Q24H  . furosemide  40 mg Oral BID  . insulin aspart  0-9 Units Subcutaneous Q8H  . ketotifen  1 drop Both Eyes BID  . pantoprazole  40 mg Oral Daily  . sodium chloride flush  10-40 mL Intracatheter Q12H   Continuous Infusions: . octreotide  (SANDOSTATIN)    IV infusion 50 mcg/hr (02/23/16 1118)     LOS: 2 days   Jebadiah Imperato, Orpah Melter, MD [facility] Pager [phone]  If 7PM-7AM, please contact night-coverage [email or web address] Password [facility] 02/23/2016, 6:25 PM

## 2016-02-23 NOTE — Progress Notes (Signed)
Received call back from Dr. Amedeo Plenty from [place]. Received verbal orders to transfer pt to stepdown and a stat CBC/with platelets. Patient transferred to room 1233 and report given to Manuela Schwartz at bedside.

## 2016-02-23 NOTE — Op Note (Signed)
[facility] Patient Name: James Russell Procedure Date: 02/23/2016 MRN: [id number] Attending MD: Missy Sabins , MD Date of Birth: [birth year] CSN: [id number] Age: 55 Admit Type: Inpatient Procedure:                Upper GI endoscopy Indications:              Hematemesis Providers:                Elyse Jarvis. Amedeo Plenty, MD, Elna Breslow, RN, Elspeth Cho, Technician Referring MD:              Medicines:                Fentanyl 50 micrograms IV, Midazolam 6 mg IV,                            Diphenhydramine [id number] mg IV Complications:            No immediate complications. Estimated Blood Loss:     Estimated blood loss: 25 mL. Procedure:                Pre-Anesthesia Assessment:                           - Prior to the procedure, a History and Physical                            was performed, and patient medications and                            allergies were reviewed. The patient's tolerance of                            previous anesthesia was also reviewed. The risks                            and benefits of the procedure and the sedation                            options and risks were discussed with the patient.                            All questions were answered, and informed consent                            was obtained. Prior Anticoagulants: The patient has                            taken no previous anticoagulant or antiplatelet                            agents. ASA Grade Assessment: III - A patient with  severe systemic disease. After reviewing the risks                            and benefits, the patient was deemed in                            satisfactory condition to undergo the procedure.                           After obtaining informed consent, the endoscope was                            passed under direct vision. Throughout the                            procedure, the patient's blood pressure,  pulse, and                            oxygen saturations were monitored continuously. The                            Endoscope was introduced through the mouth, and                            advanced to the second part of duodenum. The upper                            GI endoscopy was accomplished without difficulty.                            The patient tolerated the procedure well. Scope In: Scope Out: Findings:      Four columns of non-bleeding grade III varices were found in the middle       third of the esophagus and in the lower third of the esophagus,.       Stigmata of recent bleeding were evident and red wale signs were       present. Four bands were successfully placed with incomplete eradication       of varices. Steady bleeding continued at the end of the procedure.      Mild inflammation was found in the gastric fundus and in the gastric       body.      Hematin (altered blood/coffee-ground-like material) was found in the       entire examined stomach.      Hematin (altered blood/coffee-ground-like material) was found in the       entire duodenum. Impression:               - Recently bleeding grade III esophageal varices.                            Incompletely eradicated. Banded.                           - Gastritis.                           -  Hematin (altered blood/coffee-ground-like                            material) in the entire stomach.                           - Blood in the entire examined duodenum.                           - No specimens collected. Moderate Sedation:      Moderate (conscious) sedation was administered by the endoscopy nurse       and supervised by the endoscopist. The following parameters were       monitored: oxygen saturation, heart rate, blood pressure, respiratory       rate, EKG, adequacy of pulmonary ventilation, and response to care. Recommendation:           - Return patient to ICU until patient is stable.                            - NPO.                           - Continue present medications.                           - Administer an IV bolus of 50 micrograms of                            octreotide followed by an infusion of 50 micrograms                            per hour. Procedure Code(s):        --- Professional ---                           [phone], Esophagogastroduodenoscopy, flexible,                            transoral; with band ligation of esophageal/gastric                            varices Diagnosis Code(s):        --- Professional ---                           I85.01, Esophageal varices with bleeding                           K29.70, Gastritis, unspecified, without bleeding                           K92.2, Gastrointestinal hemorrhage, unspecified                           K92.0, Hematemesis CPT copyright 2016 [employer]. All rights reserved. The codes documented in this report are preliminary and upon coder review may  be revised to meet current compliance  requirements. Missy Sabins, MD 02/23/2016 9:26:41 PM This report has been signed electronically. Number of Addenda: 0

## 2016-02-23 NOTE — Progress Notes (Signed)
Called due to recurrent hematemesis after nonbloody emesis earlier in the morning. EGD shows grade 4 varices with 2 red nipple signs. 4 bands placed my but unfortunately one band overload the red nipple sign did not hold as did 2 others. There was bleeding occurring at the end of the procedure. We'll resume octreotide and cover with antibiotics.

## 2016-02-23 NOTE — Progress Notes (Signed)
Pt began vomiting bloody emesis, paged Dr. Wyline Copas and on call doctor with [facility]. Awaiting call back from [facility] and Dr. Wyline Copas is aware. Will continue to monitor patient.

## 2016-02-23 NOTE — Progress Notes (Signed)
[facility] Progress Note  James Russell 55 y.o. April 05, 1960  CC:   Gi Bleed   Subjective: C/O nausea and vomiting. C/O abdominal fullness. Denied black stool or blood in the stool.   ROS : Negative for diarrhea. Positive for nausea and vomiting.    Objective: Vital signs in last 24 hours: Vitals:   02/22/16 2142 02/23/16 0545  BP: 131/72 120/60  Pulse: 73 69  Resp: 16 16  Temp: 98.4 F (36.9 C) 98.5 F (36.9 C)    Physical Exam:  General:  Alert, cooperative, no distress, appears stated age  Head:  Normocephalic, without obvious abnormality, atraumatic  Eyes:  , EOM's intact,   Lungs:   Clear to auscultation bilaterally, respirations unlabored  Heart:  Regular rate and rhythm, S1, S2 normal  Abdomen:   Soft, mild distended, NT, BS +   Extremities: Extremities normal, atraumatic, no  edema      Lab Results:  Recent Labs  02/21/16 0147  NA 135  K 4.4  CL 109  CO2 22  GLUCOSE 138*  BUN 15  CREATININE 0.68  CALCIUM 7.9*    Recent Labs  02/21/16 0147  AST 32  ALT 22  ALKPHOS 142*  BILITOT 1.1  PROT 5.8*  ALBUMIN 2.8*    Recent Labs  02/21/16 0147  02/22/16 1227 02/22/16 2025  WBC 7.3  < > 3.7* 4.7  NEUTROABS 6.3  --   --   --   HGB 7.9*  < > 8.2* 9.2*  HCT 25.1*  < > 24.9* 28.7*  MCV 77.0*  < > 78.5 79.7  PLT 110*  < > 89* 106*  < > = values in this interval not displayed.  Recent Labs  02/21/16 0147  LABPROT 16.0*  INR 1.27      Assessment/Plan:   UGI bleed. Most likely variceal . EGD showed small varicse  Anemia. Stable  Cirrhosis  Nausea and vomiting.   Recommendations  -------------------------- - Patient with nausea and vomiting today. Advance diet slowly  - Continue Octreotide today. Need to continue for total 72 hours.  D/C octreotide tomorrow.  - Monitor H and H  - GI will follow.    Otis Brace MD, FACP 02/23/2016, 8:36 AM  Pager [phone]  If no answer or after 5 PM call [phone]

## 2016-02-24 LAB — COMPREHENSIVE METABOLIC PANEL
ALT: 27 U/L (ref 17–63)
ANION GAP: 6 (ref 5–15)
AST: 38 U/L (ref 15–41)
Albumin: 2.7 g/dL — ABNORMAL LOW (ref 3.5–5.0)
Alkaline Phosphatase: 143 U/L — ABNORMAL HIGH (ref 38–126)
BILIRUBIN TOTAL: 1.2 mg/dL (ref 0.3–1.2)
BUN: 12 mg/dL (ref 6–20)
CO2: 26 mmol/L (ref 22–32)
Calcium: 7.8 mg/dL — ABNORMAL LOW (ref 8.9–10.3)
Chloride: 106 mmol/L (ref 101–111)
Creatinine, Ser: 0.86 mg/dL (ref 0.61–1.24)
Glucose, Bld: 133 mg/dL — ABNORMAL HIGH (ref 65–99)
POTASSIUM: 3.6 mmol/L (ref 3.5–5.1)
Sodium: 138 mmol/L (ref 135–145)
TOTAL PROTEIN: 5.5 g/dL — AB (ref 6.5–8.1)

## 2016-02-24 LAB — CBC
HEMATOCRIT: 23.3 % — AB (ref 39.0–52.0)
Hemoglobin: 7.4 g/dL — ABNORMAL LOW (ref 13.0–17.0)
MCH: 25.3 pg — ABNORMAL LOW (ref 26.0–34.0)
MCHC: 31.8 g/dL (ref 30.0–36.0)
MCV: 79.5 fL (ref 78.0–100.0)
PLATELETS: 106 10*3/uL — AB (ref 150–400)
RBC: 2.93 MIL/uL — ABNORMAL LOW (ref 4.22–5.81)
RDW: 18.2 % — AB (ref 11.5–15.5)
WBC: 4 10*3/uL (ref 4.0–10.5)

## 2016-02-24 LAB — HEMOGLOBIN AND HEMATOCRIT, BLOOD
HCT: 28.9 % — ABNORMAL LOW (ref 39.0–52.0)
Hemoglobin: 9.3 g/dL — ABNORMAL LOW (ref 13.0–17.0)

## 2016-02-24 LAB — GLUCOSE, CAPILLARY
GLUCOSE-CAPILLARY: 134 mg/dL — AB (ref 65–99)
GLUCOSE-CAPILLARY: 138 mg/dL — AB (ref 65–99)
Glucose-Capillary: 256 mg/dL — ABNORMAL HIGH (ref 65–99)
Glucose-Capillary: 126 mg/dL — ABNORMAL HIGH (ref 65–99)

## 2016-02-24 LAB — PREPARE RBC (CROSSMATCH)

## 2016-02-24 MED ORDER — SODIUM CHLORIDE 0.9 % IV SOLN
Freq: Once | INTRAVENOUS | Status: AC
  Administered 2016-02-24: 10:00:00 via INTRAVENOUS

## 2016-02-24 MED ORDER — OCTREOTIDE ACETATE 500 MCG/ML IJ SOLN
50.0000 ug/h | INTRAMUSCULAR | Status: DC
  Administered 2016-02-24 (×2): 50 ug/h via INTRAVENOUS
  Filled 2016-02-24 (×4): qty 1

## 2016-02-24 MED ORDER — OCTREOTIDE LOAD VIA INFUSION
50.0000 ug | Freq: Once | INTRAVENOUS | Status: AC
  Administered 2016-02-24: 50 ug via INTRAVENOUS
  Filled 2016-02-24: qty 25

## 2016-02-24 MED ORDER — FUROSEMIDE 10 MG/ML IJ SOLN
20.0000 mg | Freq: Once | INTRAMUSCULAR | Status: AC
  Administered 2016-02-24: 20 mg via INTRAVENOUS
  Filled 2016-02-24: qty 2

## 2016-02-24 NOTE — Progress Notes (Signed)
PROGRESS NOTE    James Russell  [id number] DOB: [birth year] DOA: 02/20/2016 PCP: Pcp Not In System    Brief Narrative:  56 year old male with a history of colon cancer resected in the past but with metastases to the liver undergoing chemotherapy. He also has a history of esophageal varices and about a month ago had an upper GI bleed and underwent upper endoscopy with esophageal band ligation. He was to come back for repeat endoscopy the first week of December for further investigation and to see whether he need further esophageal banding. Last evening he had an episode of hematemesis and came to the hospital where he was subsequently admitted. He has not had any hematemesis in the past several hours. He has been started on an octreotide drip.  Assessment & Plan:   Principal Problem:   Hematemesis with nausea Active Problems:   Acute blood loss anemia   Thrombocytopenia (HCC)   Metastatic colon cancer to liver [facility])   Peripheral neuropathy due to chemotherapy (HCC)   Malnutrition of moderate degree  1. Hematemesis:    - suspect secondary to variceal bleed - Patient is continued on octreotide and PPI gtt - Patient is now status post EGD on 02/21/2016 with findings of grade 1-2 esophageal varices that are likely the source of bleeding, however the varices are too small to band at that time. There was a small hiatal hernia with portal hypertensive gastropathy. -On 11/24, patient noted to have hemoptysis with nausea/vomiting. Patient underwent emergent EGD with findings of recently bleeding grade 3 esophageal varices that were banded. - Continue per GI recommendations  2. Acute blood loss anemia secondary to variceal bleed: -Earlier, patient was given 1 unit of packed blood blood cells with posttransfusion hemoglobin of 8.1 -Hgb trends noted to have risen to peak of 9.5 -On 11/25, hgb noted to be in the 7 range -will order 1 unit PRBC -Repeat CBC in AM  3. Diabetes:  -Cont SSI    -Random glucose reviewed - stable at 133  4. HTN:  -Holding blood pressure regimen for now -BP remains stable this AM  5. Other medications:  -Had resumed Lyrica, oxycodone -continued on necessary alprazolam -Had continued  atorvastatin  6. Thrombocytopenia:  -Had been gradually improving -Plts today remain over 100k  7. Metastatic colon cancer:  -Hold Lonsurf for now -Appears stable  8. Constipation -xray of abdomen ordered, reviewed -Findings of moderate stool burden -Mg citrate was given, good results reported overnight -Patient has noted symptom improvement  DVT prophylaxis: SCD's Code Status: Full Family Communication: Pt in room, family at bedside Disposition Plan: Unceratin at this time  Consultants:   GI  Procedures:   EGD 11/22  Emergent EGD 11/24  Antimicrobials: Anti-infectives    Start     Dose/Rate Route Frequency Ordered Stop   02/23/16 2200  cefTRIAXone (ROCEPHIN) 1 g in dextrose 5 % 50 mL IVPB  Status:  Discontinued     1 g 100 mL/hr over 30 Minutes Intravenous Every 24 hours 02/23/16 2056 02/24/16 1246   02/23/16 2000  ciprofloxacin (CIPRO) IVPB 400 mg     400 mg 200 mL/hr over 60 Minutes Intravenous  Once 02/23/16 1929 02/24/16 0108   02/23/16 0500  cefTRIAXone (ROCEPHIN) 2 g in dextrose 5 % 50 mL IVPB     2 g 100 mL/hr over 30 Minutes Intravenous Every 24 hours 02/22/16 0912     02/22/16 0500  cefTRIAXone (ROCEPHIN) 1 g in dextrose 5 % 50 mL IVPB  Status:  Discontinued     1 g 100 mL/hr over 30 Minutes Intravenous Every 24 hours 02/21/16 0709 02/22/16 0912   02/21/16 0430  cefTRIAXone (ROCEPHIN) 2 g in dextrose 5 % 50 mL IVPB     2 g 100 mL/hr over 30 Minutes Intravenous  Once 02/21/16 0427 02/21/16 0504      Subjective: Feels better this am  Objective: Vitals:   02/24/16 0800 02/24/16 1200 02/24/16 1232 02/24/16 1300  BP: 125/66 128/74 133/67 137/72  Pulse: 83 82 92 86  Resp: 13 13 20 16   Temp: 98.7 F (37.1 C) 99.1 F  (37.3 C) 99.1 F (37.3 C) 99.1 F (37.3 C)  TempSrc: Oral Oral Oral Oral  SpO2: 96% 98% 98% 99%  Weight:      Height:        Intake/Output Summary (Last 24 hours) at 02/24/16 1545 Last data filed at 02/24/16 0600  Gross per 24 hour  Intake          1388.33 ml  Output              725 ml  Net           663.33 ml   Filed Weights   02/20/16 2328  Weight: 81.6 kg (180 lb)    Examination:  General exam: Awake, conversant, in nad Respiratory system: normal resp effort, no crackles Cardiovascular system: regular rate, s1-s2 Gastrointestinal system: less distended, pos BS Central nervous system: cn2-12 grossly intact, sensation intact Extremities: perfused, no clubbing Skin: normal skin turgor, no notable skin lesions seen Psychiatry: affect mood, no visual hallucinations   Data Reviewed: I have personally reviewed following labs and imaging studies  CBC:  Recent Labs Lab 02/21/16 0147  02/22/16 0801 02/22/16 1227 02/22/16 2025 02/23/16 1748 02/24/16 0505  WBC 7.3  < > 3.6* 3.7* 4.7 7.5 4.0  NEUTROABS 6.3  --   --   --   --  5.8  --   HGB 7.9*  < > 8.1* 8.2* 9.2* 9.5*  9.4* 7.4*  HCT 25.1*  < > 25.4* 24.9* 28.7* 30.3*  30.2* 23.3*  MCV 77.0*  < > 80.1 78.5 79.7 79.3 79.5  PLT 110*  < > 78* 89* 106* 182 106*  < > = values in this interval not displayed. Basic Metabolic Panel:  Recent Labs Lab 02/21/16 0147 02/24/16 0505  NA 135 138  K 4.4 3.6  CL 109 106  CO2 22 26  GLUCOSE 138* 133*  BUN 15 12  CREATININE 0.68 0.86  CALCIUM 7.9* 7.8*   GFR: Estimated Creatinine Clearance: 100.9 mL/min (by C-G formula based on SCr of 0.86 mg/dL). Liver Function Tests:  Recent Labs Lab 02/21/16 0147 02/24/16 0505  AST 32 38  ALT 22 27  ALKPHOS 142* 143*  BILITOT 1.1 1.2  PROT 5.8* 5.5*  ALBUMIN 2.8* 2.7*    Recent Labs Lab 02/21/16 0147  LIPASE 46   No results for input(s): AMMONIA in the last 168 hours. Coagulation Profile:  Recent Labs Lab  02/21/16 0147  INR 1.27   Cardiac Enzymes: No results for input(s): CKTOTAL, CKMB, CKMBINDEX, TROPONINI in the last 168 hours. BNP (last 3 results) No results for input(s): PROBNP in the last 8760 hours. HbA1C: No results for input(s): HGBA1C in the last 72 hours. CBG:  Recent Labs Lab 02/23/16 0745 02/23/16 1554 02/24/16 0007 02/24/16 0813 02/24/16 1535  GLUCAP 132* 162* 138* 134* 256*   Lipid Profile: No results for input(s): CHOL, HDL,  LDLCALC, TRIG, CHOLHDL, LDLDIRECT in the last 72 hours. Thyroid Function Tests: No results for input(s): TSH, T4TOTAL, FREET4, T3FREE, THYROIDAB in the last 72 hours. Anemia Panel: No results for input(s): VITAMINB12, FOLATE, FERRITIN, TIBC, IRON, RETICCTPCT in the last 72 hours. Sepsis Labs: No results for input(s): PROCALCITON, LATICACIDVEN in the last 168 hours.  Recent Results (from the past 240 hour(s))  MRSA PCR Screening     Status: None   Collection Time: 02/21/16  5:54 AM  Result Value Ref Range Status   MRSA by PCR NEGATIVE NEGATIVE Final    Comment:        The GeneXpert MRSA Assay (FDA approved for NASAL specimens only), is one component of a comprehensive MRSA colonization surveillance program. It is not intended to diagnose MRSA infection nor to guide or monitor treatment for MRSA infections.      Radiology Studies: Dg Abd Portable 1v  Result Date: 02/23/2016 CLINICAL DATA:  Abdominal pain nausea and vomiting EXAM: PORTABLE ABDOMEN - 1 VIEW COMPARISON:  CT abdomen and pelvis April 28, 2013 FINDINGS: There is moderate stool throughout colon. There is no bowel dilatation or air-fluid level suggesting bowel obstruction. No free air. There is postoperative change in the right abdomen. There is degenerative change in the lumbar spine. IMPRESSION: No bowel obstruction or free air. Electronically Signed   By: Lowella Grip III M.D.   On: 02/23/2016 12:32    Scheduled Meds: . atorvastatin  80 mg Oral Daily  .  cefTRIAXone (ROCEPHIN)  IV  2 g Intravenous Q24H  . furosemide  20 mg Intravenous Once  . furosemide  40 mg Oral BID  . insulin aspart  0-9 Units Subcutaneous Q8H  . ketotifen  1 drop Both Eyes BID  . pantoprazole  40 mg Oral Daily  . sodium chloride flush  10-40 mL Intracatheter Q12H   Continuous Infusions: . octreotide  (SANDOSTATIN)    IV infusion 50 mcg/hr (02/24/16 0100)  . octreotide  (SANDOSTATIN)    IV infusion 50 mcg/hr (02/24/16 1003)     LOS: 3 days   CHIU, Orpah Melter, MD [facility] Pager [phone]  If 7PM-7AM, please contact night-coverage [email or web address] Password [facility] 02/24/2016, 3:45 PM

## 2016-02-24 NOTE — Progress Notes (Signed)
[facility] Progress Note  Subjective: No nausea or vomiting. Has had some dark stools. Hemodynamically stable  Objective: Vital signs in last 24 hours: Temp:  [97.9 F (36.6 C)-98.8 F (37.1 C)] 98.7 F (37.1 C) (11/25 0800) Pulse Rate:  [82-105] 85 (11/25 0600) Resp:  [10-23] 11 (11/25 0600) BP: (102-160)/(53-91) 117/67 (11/25 0600) SpO2:  [95 %-100 %] 96 % (11/25 0600) Weight change:    PE: Alert, oriented no acute distress  Lab Results: Results for orders placed or performed during the hospital encounter of 02/20/16 (from the past 24 hour(s))  Glucose, capillary     Status: Abnormal   Collection Time: 02/23/16  3:54 PM  Result Value Ref Range   Glucose-Capillary 162 (H) 65 - 99 mg/dL  Hemoglobin and hematocrit, blood     Status: Abnormal   Collection Time: 02/23/16  5:48 PM  Result Value Ref Range   Hemoglobin 9.4 (L) 13.0 - 17.0 g/dL   HCT 30.2 (L) 39.0 - 52.0 %  CBC with Differential/Platelet     Status: Abnormal   Collection Time: 02/23/16  5:48 PM  Result Value Ref Range   WBC 7.5 4.0 - 10.5 K/uL   RBC 3.82 (L) 4.22 - 5.81 MIL/uL   Hemoglobin 9.5 (L) 13.0 - 17.0 g/dL   HCT 30.3 (L) 39.0 - 52.0 %   MCV 79.3 78.0 - 100.0 fL   MCH 24.9 (L) 26.0 - 34.0 pg   MCHC 31.4 30.0 - 36.0 g/dL   RDW 18.1 (H) 11.5 - 15.5 %   Platelets 182 150 - 400 K/uL   Neutrophils Relative % 78 %   Neutro Abs 5.8 1.7 - 7.7 K/uL   Lymphocytes Relative 10 %   Lymphs Abs 0.8 0.7 - 4.0 K/uL   Monocytes Relative 9 %   Monocytes Absolute 0.7 0.1 - 1.0 K/uL   Eosinophils Relative 3 %   Eosinophils Absolute 0.2 0.0 - 0.7 K/uL   Basophils Relative 0 %   Basophils Absolute 0.0 0.0 - 0.1 K/uL  Glucose, capillary     Status: Abnormal   Collection Time: 02/24/16 12:07 AM  Result Value Ref Range   Glucose-Capillary 138 (H) 65 - 99 mg/dL  CBC     Status: Abnormal   Collection Time: 02/24/16  5:05 AM  Result Value Ref Range   WBC 4.0 4.0 - 10.5 K/uL   RBC 2.93 (L) 4.22 - 5.81  MIL/uL   Hemoglobin 7.4 (L) 13.0 - 17.0 g/dL   HCT 23.3 (L) 39.0 - 52.0 %   MCV 79.5 78.0 - 100.0 fL   MCH 25.3 (L) 26.0 - 34.0 pg   MCHC 31.8 30.0 - 36.0 g/dL   RDW 18.2 (H) 11.5 - 15.5 %   Platelets 106 (L) 150 - 400 K/uL  Comprehensive metabolic panel     Status: Abnormal   Collection Time: 02/24/16  5:05 AM  Result Value Ref Range   Sodium 138 135 - 145 mmol/L   Potassium 3.6 3.5 - 5.1 mmol/L   Chloride 106 101 - 111 mmol/L   CO2 26 22 - 32 mmol/L   Glucose, Bld 133 (H) 65 - 99 mg/dL   BUN 12 6 - 20 mg/dL   Creatinine, Ser 0.86 0.61 - 1.24 mg/dL   Calcium 7.8 (L) 8.9 - 10.3 mg/dL   Total Protein 5.5 (L) 6.5 - 8.1 g/dL   Albumin 2.7 (L) 3.5 - 5.0 g/dL   AST 38 15 - 41 U/L   ALT 27 17 -  63 U/L   Alkaline Phosphatase 143 (H) 38 - 126 U/L   Total Bilirubin 1.2 0.3 - 1.2 mg/dL   GFR calc non Af Amer >60 >60 mL/min   GFR calc Af Amer >60 >60 mL/min   Anion gap 6 5 - 15  Prepare RBC     Status: None   Collection Time: 02/24/16  8:08 AM  Result Value Ref Range   Order Confirmation ORDER PROCESSED BY BLOOD BANK   Glucose, capillary     Status: Abnormal   Collection Time: 02/24/16  8:13 AM  Result Value Ref Range   Glucose-Capillary 134 (H) 65 - 99 mg/dL   Comment 1 Notify RN    Comment 2 Document in Chart     Studies/Results: Dg Abd Portable 1v  Result Date: 02/23/2016 CLINICAL DATA:  Abdominal pain nausea and vomiting EXAM: PORTABLE ABDOMEN - 1 VIEW COMPARISON:  CT abdomen and pelvis April 28, 2013 FINDINGS: There is moderate stool throughout colon. There is no bowel dilatation or air-fluid level suggesting bowel obstruction. No free air. There is postoperative change in the right abdomen. There is degenerative change in the lumbar spine. IMPRESSION: No bowel obstruction or free air. Electronically Signed   By: Lowella Grip III M.D.   On: 02/23/2016 12:32      Assessment: Recurrent esophageal variceal bleeding status post repeat banding.  Plan: Continue to  monitor for rebleeding. Continue octreotide for now. Will advance to full liquid diet.    [name] 02/24/2016, 12:04 PM  Pager [phone] If no answer or after 5 PM call [phone]

## 2016-02-25 LAB — GLUCOSE, CAPILLARY
GLUCOSE-CAPILLARY: 173 mg/dL — AB (ref 65–99)
GLUCOSE-CAPILLARY: 138 mg/dL — AB (ref 65–99)
Glucose-Capillary: 142 mg/dL — ABNORMAL HIGH (ref 65–99)
Glucose-Capillary: 161 mg/dL — ABNORMAL HIGH (ref 65–99)
Glucose-Capillary: 137 mg/dL — ABNORMAL HIGH (ref 65–99)

## 2016-02-25 LAB — TYPE AND SCREEN
ABO/RH(D): A POS
ANTIBODY SCREEN: NEGATIVE
UNIT DIVISION: 0
Unit division: 0

## 2016-02-25 LAB — CBC
HEMATOCRIT: 23.8 % — AB (ref 39.0–52.0)
HEMATOCRIT: 26.6 % — AB (ref 39.0–52.0)
HEMOGLOBIN: 7.6 g/dL — AB (ref 13.0–17.0)
Hemoglobin: 8.5 g/dL — ABNORMAL LOW (ref 13.0–17.0)
MCH: 25.7 pg — AB (ref 26.0–34.0)
MCH: 25.7 pg — ABNORMAL LOW (ref 26.0–34.0)
MCHC: 31.9 g/dL (ref 30.0–36.0)
MCHC: 32 g/dL (ref 30.0–36.0)
MCV: 80.4 fL (ref 78.0–100.0)
MCV: 80.4 fL (ref 78.0–100.0)
PLATELETS: 93 10*3/uL — AB (ref 150–400)
Platelets: 83 10*3/uL — ABNORMAL LOW (ref 150–400)
RBC: 2.96 MIL/uL — AB (ref 4.22–5.81)
RBC: 3.31 MIL/uL — AB (ref 4.22–5.81)
RDW: 18.8 % — ABNORMAL HIGH (ref 11.5–15.5)
RDW: 18.9 % — AB (ref 11.5–15.5)
WBC: 4.6 10*3/uL (ref 4.0–10.5)
WBC: 4.8 10*3/uL (ref 4.0–10.5)

## 2016-02-25 NOTE — Progress Notes (Signed)
[facility] Progress Note  Subjective: Patient feels fine, states that last bowel movement yesterday was green. Hemoglobin 8.5 down slightly from post transfusion 9.3  Objective: Vital signs in last 24 hours: Temp:  [98.4 F (36.9 C)-99 F (37.2 C)] 98.4 F (36.9 C) (11/26 0800) Pulse Rate:  [83-106] 83 (11/26 0500) Resp:  [11-21] 12 (11/26 0500) BP: (128-148)/(68-105) 139/75 (11/26 0500) SpO2:  [92 %-99 %] 97 % (11/26 0500) Weight change:    PE: Unchanged  Lab Results: Results for orders placed or performed during the hospital encounter of 02/20/16 (from the past 24 hour(s))  Glucose, capillary     Status: Abnormal   Collection Time: 02/24/16  3:35 PM  Result Value Ref Range   Glucose-Capillary 256 (H) 65 - 99 mg/dL  Hemoglobin and hematocrit, blood     Status: Abnormal   Collection Time: 02/24/16  7:15 PM  Result Value Ref Range   Hemoglobin 9.3 (L) 13.0 - 17.0 g/dL   HCT 28.9 (L) 39.0 - 52.0 %  Glucose, capillary     Status: Abnormal   Collection Time: 02/24/16 11:32 PM  Result Value Ref Range   Glucose-Capillary 126 (H) 65 - 99 mg/dL   Comment 1 Notify RN    Comment 2 Document in Chart   CBC     Status: Abnormal   Collection Time: 02/25/16  4:48 AM  Result Value Ref Range   WBC 4.6 4.0 - 10.5 K/uL   RBC 2.96 (L) 4.22 - 5.81 MIL/uL   Hemoglobin 7.6 (L) 13.0 - 17.0 g/dL   HCT 23.8 (L) 39.0 - 52.0 %   MCV 80.4 78.0 - 100.0 fL   MCH 25.7 (L) 26.0 - 34.0 pg   MCHC 31.9 30.0 - 36.0 g/dL   RDW 18.8 (H) 11.5 - 15.5 %   Platelets 83 (L) 150 - 400 K/uL  Glucose, capillary     Status: Abnormal   Collection Time: 02/25/16  7:41 AM  Result Value Ref Range   Glucose-Capillary 142 (H) 65 - 99 mg/dL   Comment 1 Notify RN    Comment 2 Document in Chart   CBC     Status: Abnormal   Collection Time: 02/25/16  8:55 AM  Result Value Ref Range   WBC 4.8 4.0 - 10.5 K/uL   RBC 3.31 (L) 4.22 - 5.81 MIL/uL   Hemoglobin 8.5 (L) 13.0 - 17.0 g/dL   HCT 26.6 (L) 39.0 -  52.0 %   MCV 80.4 78.0 - 100.0 fL   MCH 25.7 (L) 26.0 - 34.0 pg   MCHC 32.0 30.0 - 36.0 g/dL   RDW 18.9 (H) 11.5 - 15.5 %   Platelets 93 (L) 150 - 400 K/uL    Studies/Results: No results found.    Assessment: Esophageal variceal bleed status post banding  Plan: Will stop octreotide, advance diet, recheck hemoglobin in the morning. If no evidence of recurrent bleeding okay to discharge in the morning. He has follow-up esophageal banding with me on December 7.    Zyana Amaro C 02/25/2016, 1:58 PM  Pager [phone] If no answer or after 5 PM call [phone]

## 2016-02-25 NOTE — Progress Notes (Signed)
PROGRESS NOTE    James Russell  [id number] DOB: Jun 04, 1960 DOA: 02/20/2016 PCP: Pcp Not In System    Brief Narrative:  55 year old male with a history of colon cancer resected in the past but with metastases to the liver undergoing chemotherapy. He also has a history of esophageal varices and about a month ago had an upper GI bleed and underwent upper endoscopy with esophageal band ligation. He was to come back for repeat endoscopy the first week of December for further investigation and to see whether he need further esophageal banding. Last evening he had an episode of hematemesis and came to the hospital where he was subsequently admitted. He has not had any hematemesis in the past several hours. He has been started on an octreotide drip.  Assessment & Plan:   Principal Problem:   Hematemesis with nausea Active Problems:   Acute blood loss anemia   Thrombocytopenia (HCC)   Metastatic colon cancer to liver [facility])   Peripheral neuropathy due to chemotherapy (HCC)   Malnutrition of moderate degree  1. Hematemesis:    - suspect secondary to variceal bleed - Patient is continued on octreotide and PPI gtt - Patient is now status post EGD on 02/21/2016 with findings of grade 1-2 esophageal varices that are likely the source of bleeding, however the varices are too small to band at that time. There was a small hiatal hernia with portal hypertensive gastropathy. -On 11/24, patient noted to have hemoptysis with nausea/vomiting. Patient underwent emergent EGD with findings of recently bleeding grade 3 esophageal varices that were banded. - Labs reviewed. Hemoglobin this morning lower at 7.5. Suspect delusional, repeated CBC with hemoglobin up to 8.5.  2. Acute blood loss anemia secondary to variceal bleed: -Earlier, patient was given 1 unit of packed blood blood cells with posttransfusion hemoglobin of 8.1, peaking to 9.5 -On 11/25, hgb noted to be in the 7 range -Posttransfusion  hemoglobin of 8.5 this morning  3. Diabetes:  -Cont SSI  -Glucose this morning 142  4. HTN:  -Blood pressure medications remain on hold for now -Blood pressure reviewed, currently stable  5. Other medications:  -Had resumed Lyrica, oxycodone -continued on necessary alprazolam -Had continued  Atorvastatin -Stable  6. Thrombocytopenia:  -Platelets currently less than 100,000 -Repeat CBC in the morning  7. Metastatic colon cancer:  -Hold Lonsurf for now -Stable at present  8. Constipation -xray of abdomen recently ordered, reviewed -Findings of moderate stool burden -Mg citrate was given recently with good results -Patient continues with multiple bowel movements overnight -resolved  DVT prophylaxis: SCD's Code Status: Full Family Communication: Pt in room, family at bedside Disposition Plan: Unceratin at this time  Consultants:   GI  Procedures:   EGD 11/22  Emergent EGD 11/24  Antimicrobials: Anti-infectives    Start     Dose/Rate Route Frequency Ordered Stop   02/23/16 2200  cefTRIAXone (ROCEPHIN) 1 g in dextrose 5 % 50 mL IVPB  Status:  Discontinued     1 g 100 mL/hr over 30 Minutes Intravenous Every 24 hours 02/23/16 2056 02/24/16 1246   02/23/16 2000  ciprofloxacin (CIPRO) IVPB 400 mg     400 mg 200 mL/hr over 60 Minutes Intravenous  Once 02/23/16 1929 02/24/16 0108   02/23/16 0500  cefTRIAXone (ROCEPHIN) 2 g in dextrose 5 % 50 mL IVPB     2 g 100 mL/hr over 30 Minutes Intravenous Every 24 hours 02/22/16 0912     02/22/16 0500  cefTRIAXone (ROCEPHIN) 1 g  in dextrose 5 % 50 mL IVPB  Status:  Discontinued     1 g 100 mL/hr over 30 Minutes Intravenous Every 24 hours 02/21/16 0709 02/22/16 0912   02/21/16 0430  cefTRIAXone (ROCEPHIN) 2 g in dextrose 5 % 50 mL IVPB     2 g 100 mL/hr over 30 Minutes Intravenous  Once 02/21/16 0427 02/21/16 0504      Subjective: Feels better this am  Objective: Vitals:   02/25/16 0300 02/25/16 0400 02/25/16 0500  02/25/16 0800  BP: (!) 143/72 135/78 139/75   Pulse: 83 94 83   Resp: 16 19 12    Temp:    98.4 F (36.9 C)  TempSrc:    Oral  SpO2: 94% 92% 97%   Weight:      Height:        Intake/Output Summary (Last 24 hours) at 02/25/16 1542 Last data filed at 02/25/16 0359  Gross per 24 hour  Intake             1115 ml  Output             1000 ml  Net              115 ml   Filed Weights   02/20/16 2328  Weight: 81.6 kg (180 lb)    Examination:  General exam: [place], no acute distress Respiratory system: Normal Chest rise, no audible wheezing Cardiovascular system: Annular rhythm, S1-S2 Gastrointestinal system: Positive bowel sounds, nontender Central nervous system: No seizures, no tremors Extremities: No cyanosis, no joint deformities Skin: No rashes, no pallor Psychiatry: Mood normal, no auditory hallucinations  Data Reviewed: I have personally reviewed following labs and imaging studies  CBC:  Recent Labs Lab 02/21/16 0147  02/22/16 2025 02/23/16 1748 02/24/16 0505 02/24/16 1915 02/25/16 0448 02/25/16 0855  WBC 7.3  < > 4.7 7.5 4.0  --  4.6 4.8  NEUTROABS 6.3  --   --  5.8  --   --   --   --   HGB 7.9*  < > 9.2* 9.5*  9.4* 7.4* 9.3* 7.6* 8.5*  HCT 25.1*  < > 28.7* 30.3*  30.2* 23.3* 28.9* 23.8* 26.6*  MCV 77.0*  < > 79.7 79.3 79.5  --  80.4 80.4  PLT 110*  < > 106* 182 106*  --  83* 93*  < > = values in this interval not displayed. Basic Metabolic Panel:  Recent Labs Lab 02/21/16 0147 02/24/16 0505  NA 135 138  K 4.4 3.6  CL 109 106  CO2 22 26  GLUCOSE 138* 133*  BUN 15 12  CREATININE 0.68 0.86  CALCIUM 7.9* 7.8*   GFR: Estimated Creatinine Clearance: 100.9 mL/min (by C-G formula based on SCr of 0.86 mg/dL). Liver Function Tests:  Recent Labs Lab 02/21/16 0147 02/24/16 0505  AST 32 38  ALT 22 27  ALKPHOS 142* 143*  BILITOT 1.1 1.2  PROT 5.8* 5.5*  ALBUMIN 2.8* 2.7*    Recent Labs Lab 02/21/16 0147  LIPASE 46   No results for  input(s): AMMONIA in the last 168 hours. Coagulation Profile:  Recent Labs Lab 02/21/16 0147  INR 1.27   Cardiac Enzymes: No results for input(s): CKTOTAL, CKMB, CKMBINDEX, TROPONINI in the last 168 hours. BNP (last 3 results) No results for input(s): PROBNP in the last 8760 hours. HbA1C: No results for input(s): HGBA1C in the last 72 hours. CBG:  Recent Labs Lab 02/24/16 0007 02/24/16 0813 02/24/16 1535 02/24/16 2332 02/25/16  0741  GLUCAP 138* 134* 256* 126* 142*   Lipid Profile: No results for input(s): CHOL, HDL, LDLCALC, TRIG, CHOLHDL, LDLDIRECT in the last 72 hours. Thyroid Function Tests: No results for input(s): TSH, T4TOTAL, FREET4, T3FREE, THYROIDAB in the last 72 hours. Anemia Panel: No results for input(s): VITAMINB12, FOLATE, FERRITIN, TIBC, IRON, RETICCTPCT in the last 72 hours. Sepsis Labs: No results for input(s): PROCALCITON, LATICACIDVEN in the last 168 hours.  Recent Results (from the past 240 hour(s))  MRSA PCR Screening     Status: None   Collection Time: 02/21/16  5:54 AM  Result Value Ref Range Status   MRSA by PCR NEGATIVE NEGATIVE Final    Comment:        The GeneXpert MRSA Assay (FDA approved for NASAL specimens only), is one component of a comprehensive MRSA colonization surveillance program. It is not intended to diagnose MRSA infection nor to guide or monitor treatment for MRSA infections.      Radiology Studies: No results found.  Scheduled Meds: . atorvastatin  80 mg Oral Daily  . cefTRIAXone (ROCEPHIN)  IV  2 g Intravenous Q24H  . furosemide  40 mg Oral BID  . insulin aspart  0-9 Units Subcutaneous Q8H  . ketotifen  1 drop Both Eyes BID  . pantoprazole  40 mg Oral Daily  . sodium chloride flush  10-40 mL Intracatheter Q12H   Continuous Infusions:    LOS: 4 days   CHIU, Orpah Melter, MD [facility] Pager [phone]  If 7PM-7AM, please contact night-coverage [email or web address] Password [facility] 02/25/2016, 3:42 PM

## 2016-02-26 ENCOUNTER — Encounter (HOSPITAL_COMMUNITY): Payer: Self-pay | Admitting: Gastroenterology

## 2016-02-26 DIAGNOSIS — E440 Moderate protein-calorie malnutrition: Secondary | ICD-10-CM

## 2016-02-26 LAB — CBC
HCT: 23.9 % — ABNORMAL LOW (ref 39.0–52.0)
HCT: 27.2 % — ABNORMAL LOW (ref 39.0–52.0)
Hemoglobin: 7.6 g/dL — ABNORMAL LOW (ref 13.0–17.0)
Hemoglobin: 8.7 g/dL — ABNORMAL LOW (ref 13.0–17.0)
MCH: 25.7 pg — ABNORMAL LOW (ref 26.0–34.0)
MCH: 25.6 pg — ABNORMAL LOW (ref 26.0–34.0)
MCHC: 31.8 g/dL (ref 30.0–36.0)
MCHC: 32 g/dL (ref 30.0–36.0)
MCV: 80.7 fL (ref 78.0–100.0)
MCV: 80 fL (ref 78.0–100.0)
PLATELETS: 80 10*3/uL — AB (ref 150–400)
PLATELETS: 75 10*3/uL — AB (ref 150–400)
RBC: 2.96 MIL/uL — ABNORMAL LOW (ref 4.22–5.81)
RBC: 3.4 MIL/uL — AB (ref 4.22–5.81)
RDW: 19.2 % — AB (ref 11.5–15.5)
RDW: 19 % — ABNORMAL HIGH (ref 11.5–15.5)
WBC: 4.8 10*3/uL (ref 4.0–10.5)
WBC: 5.4 10*3/uL (ref 4.0–10.5)

## 2016-02-26 LAB — GLUCOSE, CAPILLARY
GLUCOSE-CAPILLARY: 142 mg/dL — AB (ref 65–99)
GLUCOSE-CAPILLARY: 144 mg/dL — AB (ref 65–99)

## 2016-02-26 MED ORDER — HEPARIN SOD (PORK) LOCK FLUSH 100 UNIT/ML IV SOLN
500.0000 [IU] | INTRAVENOUS | Status: AC | PRN
  Administered 2016-02-26: 500 [IU]

## 2016-02-26 MED ORDER — POLYETHYLENE GLYCOL 3350 17 G PO PACK
17.0000 g | PACK | Freq: Every day | ORAL | Status: DC
  Administered 2016-02-26: 17 g via ORAL
  Filled 2016-02-26: qty 1

## 2016-02-26 MED ORDER — DOCUSATE SODIUM 100 MG PO CAPS: 100.0000 mg | ORAL_CAPSULE | Freq: Every day | ORAL | 0 refills | Status: AC

## 2016-02-26 MED ORDER — SODIUM CHLORIDE 0.9% FLUSH: 10.0000 mL | INTRAVENOUS | Status: DC | PRN

## 2016-02-26 MED ORDER — NADOLOL 20 MG PO TABS: 20.0000 mg | ORAL_TABLET | Freq: Every day | ORAL | 0 refills | Status: AC

## 2016-02-26 MED ORDER — NADOLOL 20 MG PO TABS
20.0000 mg | ORAL_TABLET | Freq: Every day | ORAL | Status: DC
  Administered 2016-02-26: 20 mg via ORAL
  Filled 2016-02-26 (×2): qty 1

## 2016-02-26 MED ORDER — SODIUM CHLORIDE 0.9% FLUSH: 10.0000 mL | Freq: Two times a day (BID) | INTRAVENOUS | Status: DC

## 2016-02-26 MED ORDER — DOCUSATE SODIUM 100 MG PO CAPS
100.0000 mg | ORAL_CAPSULE | Freq: Every day | ORAL | Status: DC
  Administered 2016-02-26: 100 mg via ORAL
  Filled 2016-02-26: qty 1

## 2016-02-26 NOTE — Progress Notes (Signed)
Date:  February 26, 2016 Chart reviewed for concurrent status and case management needs. Will continue to follow patient progress.  Iv Octreotide Drip Discharge Planning: following for needs Expected discharge date: [id number] Velva Harman, [place], [place], [place]

## 2016-02-26 NOTE — Progress Notes (Signed)
[facility] Progress Note  James Russell 56 y.o. Mar 31, 1961  CC:   Gi Bleed   Subjective:  No further evidence of bleeding. No bowel movement since yesterday. Feeling fine today  ROS : Negative for nausea, vomiting, bleeding   Objective: Vital signs in last 24 hours: Vitals:   02/26/16 0440 02/26/16 0800  BP:  (!) 152/87  Pulse:  87  Resp:  17  Temp: 98.9 F (37.2 C) 98.5 F (36.9 C)    Physical Exam:  General:  Alert, cooperative, no distress, appears stated age  Head:  Normocephalic, without obvious abnormality, atraumatic  Eyes:  , EOM's intact,   Lungs:   Clear to auscultation bilaterally, respirations unlabored  Heart:  Regular rate and rhythm, S1, S2 normal  Abdomen:   Soft, mild distended, NT, BS +   Extremities: Extremities normal, atraumatic, no  edema      Lab Results:  Recent Labs  02/24/16 0505  NA 138  K 3.6  CL 106  CO2 26  GLUCOSE 133*  BUN 12  CREATININE 0.86  CALCIUM 7.8*    Recent Labs  02/24/16 0505  AST 38  ALT 27  ALKPHOS 143*  BILITOT 1.2  PROT 5.5*  ALBUMIN 2.7*    Recent Labs  02/23/16 1748  02/26/16 0448 02/26/16 0919  WBC 7.5  < > 4.8 5.4  NEUTROABS 5.8  --   --   --   HGB 9.5*  9.4*  < > 7.6* 8.7*  HCT 30.3*  30.2*  < > 23.9* 27.2*  MCV 79.3  < > 80.7 80.0  PLT 182  < > 80* 75*  < > = values in this interval not displayed. No results for input(s): LABPROT, INR in the last 72 hours.    Assessment/Plan:   Variceal bleed. S/P EGD with EVL 11/24 by Dr. Amedeo Plenty  Anemia. Stable  Cirrhosis    Recommendations  -------------------------- - Okay to discharge from GI standpoint. - Further endoscopic band ligation is scheduled for 03/07/2016 - We'll start nadolol 20 mg - Follow-up with Dr. Amedeo Plenty in 4 weeks after discharge. - GI will sign off. Call us back if needed   Otis Brace MD, FACP 02/26/2016, 12:05 PM  Pager [phone]  If no answer or after 5 PM call [phone]

## 2016-02-26 NOTE — Progress Notes (Signed)
Discharged home with friend with prescriptions.

## 2016-02-26 NOTE — Progress Notes (Signed)
Nutrition Follow-up  DOCUMENTATION CODES:   Non-severe (moderate) malnutrition in context of acute illness/injury  INTERVENTION:  - RD will monitor for nutrition-related needs if pt unable to d/c today.  NUTRITION DIAGNOSIS:   Increased nutrient needs related to catabolic illness, cancer and cancer related treatments as evidenced by estimated needs. -ongoing  GOAL:   Patient will meet greater than or equal to 90% of their needs -no documented intakes since 11/22 assessment  MONITOR:   PO intake, Weight trends, Labs, I & O's  ASSESSMENT:   55 y.o. male with a past medical history significant for colon cancer metastatic to liver and lungs on trifluridine/tipiracil (Lonsurf), recent variceal bleed requiring intubation and banding in late Oct  who presents with hematemesis. The patient was admitted to [facility] from 10/26 to 11/3 after developing nausea followed by vomiting bright red blood and presenting to the ER with hypotension and Hgb 7.  He had emergent EGD in the ER during which 3 varices were banded, he was given blood transfusion and treated with oral cipro empirically for SBP. Since discharge, he has been back to his baseline. He had a Hgb check two days ago with Hgb 10.5. On Monday, he started a new chemotherapy agent Lonsurf. Then tonight, he had mac and cheese for dinner and shortly after started to feel nauseated and then vomited a very large bright red bloody vomit.  He called EMS, and when they arrived, vomited 4 more times bright red blood.  11/27 Diet changes as follows since previous RD visit: 11/22 @ [id number]: CLD 11/23 @ 1038: FLD 11/24 @ [id number]: NPO 11/25 @ [id number]: CLD 11/26 @ [id number]: FLD 11/26 @ [id number]: Soft  No PO intakes documented since previous RD visit. D/c order and summary currently in place with GI note earlier today stating plan for outpatient endoscopic band ligation on 03/07/16. Pt had EGD on 11/24 with findings of: esophageal varices (banded), gastritis, hematin in the  entire stomach, blood in the entire examined duodenum.   If pt unable to d/c today, RD will follow-up for nutrition-related needs and concerns. No new weight since admission.  Medications reviewed; 100 mg Colace/day, 40 mg oral Lasix BID, sliding scale Novolog, PRN Zofran, 40 mg oral Protonix/day, 17 g Miralax/day, PRN IV Compazine.  Labs reviewed; CBGs: 142 and 144 mg/dL today.  11/22 - Pt has been NPO since admission.  - He reports that he has typically had a good appetite while on oral chemo but that appetite began to decrease ~1 week ago.  - He denies feelings of nausea or abdominal pain during that time but that he simply felt overly full and had no desire to eat.  - He states that while on oral chemo he has had taste alteration ("chemo taste") and states that it is similar to a metallic taste. - He states he has experienced this in the past with chemo treatments.  - Pt states that he has noticed a decrease in strength and that he has been feeling weaker but that he has still been able to do ADLs and yard work.  - Physical assessment shows mild muscle and fat wasting.  - Mild edema to BLEs.  - Pt reports he takes Lasix at home d/t BLE edema and that he does not think he has been given Lasix since admission.  - He reports UBW of 180 lbs and that when he was d/c'ed from the hospital recently he was 212 lbs "from all of the IVFs."  - He states  that weight prior to this admission was in the 170 lb range. CBW recorded as pt's stated UBW; unsure if this weight was obtained on a scale or was simply a reported weight in the ED.   IVF: NS @ 125 mL/hr.     Diet Order:  DIET SOFT Room service appropriate? Yes; Fluid consistency: Thin  Skin:  Reviewed, no issues  Last BM:  11/26  Height:   Ht Readings from Last 1 Encounters:  02/20/16 5\' 11"  (1.803 m)    Weight:   Wt Readings from Last 1 Encounters:  02/20/16 180 lb (81.6 kg)    Ideal Body Weight:  78.18 kg  BMI:  Body mass  index is 25.1 kg/m.  Estimated Nutritional Needs:   Kcal:  2285-2530 (28-31 kcal/kg)  Protein:  98-122 grams (1.2-1.5 grams/kg)  Fluid:  >/= 2.3 L/day  EDUCATION NEEDS:   No education needs identified at this time    Jarome Matin, MS, RD, LDN, CNSC Inpatient Clinical Dietitian Pager # [phone] After hours/weekend pager # [phone]

## 2016-02-26 NOTE — Discharge Summary (Signed)
Physician Discharge Summary  James Russell [id number] DOB: 02/27/1961 DOA: 02/20/2016  PCP: Pcp Not In System  Admit date: 02/20/2016 Discharge date: 02/26/2016  Admitted From: Home Disposition:  Home  Recommendations for Outpatient Follow-up:  1. Follow up with PCP in 1-2 weeks 2. Please obtain CBC in one week  Discharge Condition:Stable CODE STATUS:Full Diet recommendation: Heart healthy   Brief/Interim Summary: 55 year old male with a history of colon cancer resected in the past but with metastases to the liver undergoing chemotherapy. He also has a history of esophageal varices and about a month ago had an upper GI bleed and underwent upper endoscopy with esophageal band ligation. He was to come back for repeat endoscopy the first week of December for further investigation and to see whether he need further esophageal banding. Last evening he had an episode of hematemesis and came to the hospital where he was subsequently admitted. He has not had any hematemesis in the past several hours. He has been started on an octreotide drip.  1. Hematemesis: - suspect secondary to variceal bleed - Patient is continued on octreotide and PPI gtt - Patient is now status post EGD on 02/21/2016 with findings of grade 1-2 esophageal varices that are likely the source of bleeding, however the varices are too small to band at that time. There was a small hiatal hernia with portal hypertensive gastropathy. -On 11/24, patient noted to have hemoptysis with nausea/vomiting. Patient underwent emergent EGD with findings of recently bleeding grade 3 esophageal varices that were banded. - Hemoglobin remained stable post-transfusion with hgb of 8.7 at time of discharge  2. Acute blood loss anemia secondary to variceal bleed: -Earlier, patient was given 1 unit of packed blood blood cells with posttransfusion hemoglobin of 8.1, peaking to 9.5 -On 11/25, hgb noted to be in the 7 range -Posttransfusion  hemoglobin of 8.7 at time of discharge  3. Diabetes: -Cont SSI  -Glucose remained stable  4. HTN: -Blood pressure medications remain on hold for now while inpatient -Resume BP meds at discharge  5. Other medications: -Had resumed Lyrica, oxycodone -continued on necessary alprazolam -Had continued  Atorvastatin -Stable  6. Thrombocytopenia: -Platelets currently less than 100,000 -Recommend repeat CBC within one week after discharge  7. Metastatic colon cancer: -Holding Lonsurf for now -Resumption of chemotherapy per discretion of Oncologist  8. Constipation -xray of abdomen recently ordered, reviewed -Findings of moderate stool burden -Mg citrate was given recently with good results -resolved  Discharge Diagnoses:  Principal Problem:   Hematemesis with nausea Active Problems:   Acute blood loss anemia   Thrombocytopenia (HCC)   Metastatic colon cancer to liver ([place])   Peripheral neuropathy due to chemotherapy (HCC)   Malnutrition of moderate degree    Discharge Instructions     Medication List    STOP taking these medications   aspirin 81 MG chewable tablet   atenolol 100 MG tablet Commonly known as:  TENORMIN   LONSURF 15-6.14 MG tablet Generic drug:  trifluridine-tipiracil     TAKE these medications   ALPRAZolam 0.5 MG tablet Commonly known as:  XANAX Take 0.5 mg by mouth at bedtime as needed for anxiety.   atorvastatin 80 MG tablet Commonly known as:  LIPITOR Take 80 mg by mouth daily.   diclofenac sodium 1 % Gel Commonly known as:  VOLTAREN Apply 2 g topically 4 (four) times daily. To left groin   docusate sodium 100 MG capsule Commonly known as:  COLACE Take 1 capsule (100 mg total) by  mouth daily. Start taking on:  02/27/2016   furosemide 40 MG tablet Commonly known as:  LASIX Take 1 tablet (40 mg total) by mouth 2 (two) times daily.   nadolol 20 MG tablet Commonly known as:  CORGARD Take 1 tablet (20 mg total) by mouth  daily.   nitroGLYCERIN 0.4 MG SL tablet Commonly known as:  NITROSTAT Place 0.4 mg under the tongue every 5 (five) minutes as needed for chest pain.   omeprazole 40 MG capsule Commonly known as:  PRILOSEC Take 40 mg by mouth daily.   ondansetron 8 MG disintegrating tablet Commonly known as:  ZOFRAN-ODT Take 8 mg by mouth every 8 (eight) hours as needed for nausea or vomiting.   oxyCODONE 5 MG immediate release tablet Commonly known as:  Oxy IR/ROXICODONE Take 10 mg by mouth every 4 (four) hours as needed for severe pain.   pregabalin 150 MG capsule Commonly known as:  LYRICA Take 1 capsule (150 mg total) by mouth 3 (three) times daily.   ramipril 5 MG capsule Commonly known as:  ALTACE Take 5 mg by mouth 2 (two) times daily.   zolpidem 10 MG tablet Commonly known as:  AMBIEN Take 10 mg by mouth at bedtime as needed for sleep.      Follow-up Information    Follow up with your PCP in 1-2 weeks Follow up.        Follow up with your Oncologist as scheduled Follow up.          Allergies  Allergen Reactions  . Tylenol [Acetaminophen]     Chills    Consultations:  Gastroenterology  Procedures/Studies: US Pelvis Limited  Result Date: 01/29/2016 CLINICAL DATA:  Evaluate for left groin pain for the past days, possible inguinal hernia. EXAM: LIMITED ULTRASOUND OF PELVIS TECHNIQUE: Limited transabdominal ultrasound examination of the pelvis was performed. COMPARISON:  Abdominal and pelvic CT scan dated April 28, 2013 and abdominal ultrasound of January 27, 2016 FINDINGS: Ascites is present in the left lower quadrant and is centered over the area of pain. In the left groin no definite hernia is observed. IMPRESSION: There is ascites in the left lower quadrant of the abdomen. No inguinal hernia is observed. If further imaging is felt indicated clinically, abdominal and pelvic CT scanning would be the most useful next imaging step. Electronically Signed   By: David  Martinique  M.D.   On: 01/29/2016 12:20   Dg Abd Portable 1v  Result Date: 02/23/2016 CLINICAL DATA:  Abdominal pain nausea and vomiting EXAM: PORTABLE ABDOMEN - 1 VIEW COMPARISON:  CT abdomen and pelvis April 28, 2013 FINDINGS: There is moderate stool throughout colon. There is no bowel dilatation or air-fluid level suggesting bowel obstruction. No free air. There is postoperative change in the right abdomen. There is degenerative change in the lumbar spine. IMPRESSION: No bowel obstruction or free air. Electronically Signed   By: Lowella Grip III M.D.   On: 02/23/2016 12:32   US Abdomen Limited Ruq  Result Date: 01/28/2016 CLINICAL DATA:  Assess for ascites. EXAM: US ABDOMEN LIMITED - RIGHT UPPER QUADRANT COMPARISON:  None. FINDINGS: Gallbladder: The gallbladder is mildly distended. The gallbladder wall measures 3.1 mm which is borderline. An 8 mm stone is identified with no Murphy's sign or pericholecystic fluid. Common bile duct: Diameter: 5.3 mm Liver: The liver is heterogeneous. By report, the patient has liver metastases but no focal masses are seen on this study. There is an apparent mildly nodular contour to the liver. There  is adjacent ascites. IMPRESSION: 1. The liver is heterogeneous with suggestion of a nodular contour. There is adjacent ascites. Despite the history of metastatic disease, no discrete masses were imaged by the sonographer. 2. The gallbladder is mildly distended and the gallbladder wall measures 3.1 mm which is borderline. A single 8 mm stone is seen with no Murphy's sign. Electronically Signed   By: Dorise Bullion III M.D   On: 01/28/2016 01:51    Subjective: No complaints  Discharge Exam: Vitals:   02/26/16 0800 02/26/16 1200  BP: (!) 152/87 126/63  Pulse: 87 89  Resp: 17 15  Temp: 98.5 F (36.9 C)    Vitals:   02/26/16 0400 02/26/16 0440 02/26/16 0800 02/26/16 1200  BP: 126/71  (!) 152/87 126/63  Pulse: 90  87 89  Resp: 16  17 15   Temp:  98.9 F (37.2 C) 98.5  F (36.9 C)   TempSrc:  Oral Oral   SpO2: 96%  99% 99%  Weight:      Height:        General: Pt is alert, awake, not in acute distress Cardiovascular: RRR, S1/S2 +, no rubs, no gallops Respiratory: CTA bilaterally, no wheezing, no rhonchi Abdominal: Soft, NT, ND, bowel sounds + Extremities: no edema, no cyanosis   The results of significant diagnostics from this hospitalization (including imaging, microbiology, ancillary and laboratory) are listed below for reference.     Microbiology: Recent Results (from the past 240 hour(s))  MRSA PCR Screening     Status: None   Collection Time: 02/21/16  5:54 AM  Result Value Ref Range Status   MRSA by PCR NEGATIVE NEGATIVE Final    Comment:        The GeneXpert MRSA Assay (FDA approved for NASAL specimens only), is one component of a comprehensive MRSA colonization surveillance program. It is not intended to diagnose MRSA infection nor to guide or monitor treatment for MRSA infections.      Labs: BNP (last 3 results) No results for input(s): BNP in the last 8760 hours. Basic Metabolic Panel:  Recent Labs Lab 02/21/16 0147 02/24/16 0505  NA 135 138  K 4.4 3.6  CL 109 106  CO2 22 26  GLUCOSE 138* 133*  BUN 15 12  CREATININE 0.68 0.86  CALCIUM 7.9* 7.8*   Liver Function Tests:  Recent Labs Lab 02/21/16 0147 02/24/16 0505  AST 32 38  ALT 22 27  ALKPHOS 142* 143*  BILITOT 1.1 1.2  PROT 5.8* 5.5*  ALBUMIN 2.8* 2.7*    Recent Labs Lab 02/21/16 0147  LIPASE 46   No results for input(s): AMMONIA in the last 168 hours. CBC:  Recent Labs Lab 02/21/16 0147  02/23/16 1748 02/24/16 0505 02/24/16 1915 02/25/16 0448 02/25/16 0855 02/26/16 0448 02/26/16 0919  WBC 7.3  < > 7.5 4.0  --  4.6 4.8 4.8 5.4  NEUTROABS 6.3  --  5.8  --   --   --   --   --   --   HGB 7.9*  < > 9.5*  9.4* 7.4* 9.3* 7.6* 8.5* 7.6* 8.7*  HCT 25.1*  < > 30.3*  30.2* 23.3* 28.9* 23.8* 26.6* 23.9* 27.2*  MCV 77.0*  < > 79.3 79.5  --   80.4 80.4 80.7 80.0  PLT 110*  < > 182 106*  --  83* 93* 80* 75*  < > = values in this interval not displayed. Cardiac Enzymes: No results for input(s): CKTOTAL, CKMB, CKMBINDEX, TROPONINI in the  last 168 hours. BNP: Invalid input(s): POCBNP CBG:  Recent Labs Lab 02/25/16 1557 02/25/16 1944 02/25/16 2314 02/26/16 0749 02/26/16 1157  GLUCAP 161* 138* 137* 142* 144*   D-Dimer No results for input(s): DDIMER in the last 72 hours. Hgb A1c No results for input(s): HGBA1C in the last 72 hours. Lipid Profile No results for input(s): CHOL, HDL, LDLCALC, TRIG, CHOLHDL, LDLDIRECT in the last 72 hours. Thyroid function studies No results for input(s): TSH, T4TOTAL, T3FREE, THYROIDAB in the last 72 hours.  Invalid input(s): FREET3 Anemia work up No results for input(s): VITAMINB12, FOLATE, FERRITIN, TIBC, IRON, RETICCTPCT in the last 72 hours. Urinalysis    Component Value Date/Time   COLORURINE AMBER (A) 01/26/2016 0427   APPEARANCEUR CLEAR 01/26/2016 0427   LABSPEC 1.024 01/26/2016 0427   PHURINE 6.0 01/26/2016 0427   GLUCOSEU 250 (A) 01/26/2016 0427   HGBUR NEGATIVE 01/26/2016 0427   BILIRUBINUR NEGATIVE 01/26/2016 0427   KETONESUR NEGATIVE 01/26/2016 0427   PROTEINUR NEGATIVE 01/26/2016 0427   UROBILINOGEN 0.2 04/28/2013 0555   NITRITE NEGATIVE 01/26/2016 0427   LEUKOCYTESUR NEGATIVE 01/26/2016 0427   Sepsis Labs Invalid input(s): PROCALCITONIN,  WBC,  LACTICIDVEN Microbiology Recent Results (from the past 240 hour(s))  MRSA PCR Screening     Status: None   Collection Time: 02/21/16  5:54 AM  Result Value Ref Range Status   MRSA by PCR NEGATIVE NEGATIVE Final    Comment:        The GeneXpert MRSA Assay (FDA approved for NASAL specimens only), is one component of a comprehensive MRSA colonization surveillance program. It is not intended to diagnose MRSA infection nor to guide or monitor treatment for MRSA infections.      SIGNED:   Donne Hazel,  MD  [facility] 02/26/2016, 2:07 PM  If 7PM-7AM, please contact night-coverage [email or web address] Password [email or web address]

## 2016-02-27 ENCOUNTER — Encounter (HOSPITAL_COMMUNITY): Payer: Self-pay | Admitting: Gastroenterology

## 2016-03-06 ENCOUNTER — Encounter (HOSPITAL_COMMUNITY): Payer: Self-pay | Admitting: *Deleted

## 2016-03-06 NOTE — Progress Notes (Signed)
Anesthesia Chart Review:  Pt is a same day work up.   Pt is a 55 year old male scheduled for EGD, gastric varices banding on 03/07/2016 with Teena Irani, MD.   - Cardiologist is Charolette Forward, MD, last office visit 09/08/15.   PMH includes:  CAD (s/p PCI/stent x 6 to CX 2007 and 2014), HTN, DM, colon cancer with liver mets.  Current smoker. BMI 25  Medications include: lipitor, lasix, nadolol, prilosec, ramipril.   Labs per GI.   1 view CXR 01/27/16: No active disease.  EKG 02/21/16: Sinus tachycardia (106 bpm). Abnormal R-wave progression, early transition.  Echo 09/13/15 (care everywhere): - LV size is normal, normal LV wall thickness. No segmental LV wall motion abnormalities. LV ejection fraction = 55-60%. LV filling pattern is impaired. - RV normal in size and function. - Mild aortic stenosis. Aortic valve peak pressure gradient is 16 mmHg. Aortic valve mean pressure gradient is 8 mmHg. There is no aortic regurgitation. - Trivial mitral valve thickening. There is trace mitral regurgitation. - Structurally normal tricuspid valve. Trace tricuspid regurgitation. - There is no pericardial effusion. - Probably no change in comparison with the prior study.  Cardiac cath 02/01/13 (care everywhere): 1.Two vessel coronary artery occlusive disease involving the LCx stents and non-dominant RCA. 2.Moderate focal disease of the distal LAD. 3.Preserved LVSF by Echo, EF = 60%. 4.Successful PCI and complex revascularization of the LCx and OMB 1 with placement of four Resolute DES's.  Pt underwent similar procedure 11/22 and 02/23/16.   If no changes, I anticipate pt can proceed with surgery as scheduled.   Willeen Cass, FNP-BC [facility] Stay [facility]/Anesthesiology Phone: [phone] 03/06/2016 4:17 PM

## 2016-03-06 NOTE — Progress Notes (Signed)
Pt denies SOB and chest pain. Pt is under the care of Dr. Terrence Dupont, Cardiology; requested echo, cardiac cath and stress test ( recent) from Dr. Terrence Dupont. Pt stated that his fasting blood glucose   ranges around 110. Pt made aware of diabetes protocol to take half dose of Lantus insulin tonight ( 21 units instead of 42 units), check BG every 2 hours prior to arrival, interventions for BG<70 (4 ounces of apple or cranberry juice) recheck BG 15 minutes after intervention, BG >220 take half of sliding scale insulin. Pt stated that he does not take Aspirin and NSAIDs. Pt made aware to stop taking vitamins, fish oil and herbal medications. Pt verbalized understanding of all pre-op instructions. Anesthesia asked to review pt history.

## 2016-03-07 ENCOUNTER — Ambulatory Visit (HOSPITAL_COMMUNITY): Payer: PRIVATE HEALTH INSURANCE | Admitting: Emergency Medicine

## 2016-03-07 ENCOUNTER — Encounter (HOSPITAL_COMMUNITY): Payer: Self-pay | Admitting: Anesthesiology

## 2016-03-07 ENCOUNTER — Ambulatory Visit (HOSPITAL_COMMUNITY)
Admission: RE | Admit: 2016-03-07 | Discharge: 2016-03-07 | Disposition: A | Discharge status: Home / Self Care | Payer: PRIVATE HEALTH INSURANCE | Source: Ambulatory Visit | Attending: Gastroenterology | Admitting: Gastroenterology

## 2016-03-07 ENCOUNTER — Encounter (HOSPITAL_COMMUNITY)
Admission: RE | Disposition: A | Discharge status: Home / Self Care | Payer: Self-pay | Source: Ambulatory Visit | Attending: Gastroenterology

## 2016-03-07 DIAGNOSIS — Z955 Presence of coronary angioplasty implant and graft: Secondary | ICD-10-CM | POA: Insufficient documentation

## 2016-03-07 DIAGNOSIS — I2510 Atherosclerotic heart disease of native coronary artery without angina pectoris: Secondary | ICD-10-CM | POA: Insufficient documentation

## 2016-03-07 DIAGNOSIS — K2210 Ulcer of esophagus without bleeding: Secondary | ICD-10-CM | POA: Insufficient documentation

## 2016-03-07 DIAGNOSIS — I8501 Esophageal varices with bleeding: Secondary | ICD-10-CM | POA: Diagnosis present

## 2016-03-07 DIAGNOSIS — I10 Essential (primary) hypertension: Secondary | ICD-10-CM | POA: Insufficient documentation

## 2016-03-07 DIAGNOSIS — C189 Malignant neoplasm of colon, unspecified: Secondary | ICD-10-CM | POA: Diagnosis not present

## 2016-03-07 DIAGNOSIS — C787 Secondary malignant neoplasm of liver and intrahepatic bile duct: Secondary | ICD-10-CM | POA: Diagnosis not present

## 2016-03-07 DIAGNOSIS — F17200 Nicotine dependence, unspecified, uncomplicated: Secondary | ICD-10-CM | POA: Diagnosis not present

## 2016-03-07 DIAGNOSIS — E119 Type 2 diabetes mellitus without complications: Secondary | ICD-10-CM | POA: Insufficient documentation

## 2016-03-07 DIAGNOSIS — I252 Old myocardial infarction: Secondary | ICD-10-CM | POA: Insufficient documentation

## 2016-03-07 DIAGNOSIS — Z79899 Other long term (current) drug therapy: Secondary | ICD-10-CM | POA: Insufficient documentation

## 2016-03-07 HISTORY — DX: Gastrointestinal hemorrhage, unspecified: K92.2

## 2016-03-07 HISTORY — PX: GASTRIC VARICES BANDING: SHX5519

## 2016-03-07 HISTORY — DX: Type 2 diabetes mellitus without complications: E11.9

## 2016-03-07 HISTORY — DX: Gastro-esophageal reflux disease without esophagitis: K21.9

## 2016-03-07 HISTORY — PX: ESOPHAGOGASTRODUODENOSCOPY (EGD) WITH PROPOFOL: SHX5813

## 2016-03-07 SURGERY — ESOPHAGOGASTRODUODENOSCOPY (EGD) WITH PROPOFOL
Anesthesia: Monitor Anesthesia Care

## 2016-03-07 MED ORDER — PROPOFOL 500 MG/50ML IV EMUL
INTRAVENOUS | Status: DC | PRN
  Administered 2016-03-07: 50 ug/kg/min via INTRAVENOUS

## 2016-03-07 MED ORDER — ONDANSETRON HCL 4 MG/2ML IJ SOLN
INTRAMUSCULAR | Status: DC | PRN
  Administered 2016-03-07: 4 mg via INTRAVENOUS

## 2016-03-07 MED ORDER — MIDAZOLAM HCL 5 MG/5ML IJ SOLN
INTRAMUSCULAR | Status: DC | PRN
  Administered 2016-03-07: 2 mg via INTRAVENOUS

## 2016-03-07 MED ORDER — SODIUM CHLORIDE 0.9 % IV SOLN: INTRAVENOUS | Status: DC

## 2016-03-07 MED ORDER — FENTANYL CITRATE (PF) 100 MCG/2ML IJ SOLN
INTRAMUSCULAR | Status: DC | PRN
  Administered 2016-03-07: 100 ug via INTRAVENOUS

## 2016-03-07 MED ORDER — LACTATED RINGERS IV SOLN
INTRAVENOUS | Status: DC
  Administered 2016-03-07: 11:00:00 via INTRAVENOUS

## 2016-03-07 MED ORDER — LACTATED RINGERS IV SOLN
INTRAVENOUS | Status: DC | PRN
  Administered 2016-03-07: 13:00:00 via INTRAVENOUS

## 2016-03-07 MED ORDER — PROPOFOL 10 MG/ML IV BOLUS
INTRAVENOUS | Status: DC | PRN
  Administered 2016-03-07: 50 mg via INTRAVENOUS

## 2016-03-07 NOTE — Discharge Instructions (Signed)
YOU HAD AN ENDOSCOPIC PROCEDURE TODAY: Refer to the procedure report and other information in the discharge instructions given to you for any specific questions about what was found during the examination. If this information does not answer your questions, please call [facility] office at [phone] to clarify.  ° °YOU SHOULD EXPECT: Some feelings of bloating in the abdomen. Passage of more gas than usual. Walking can help get rid of the air that was put into your GI tract during the procedure and reduce the bloating.  Some abdominal soreness may be present for a day or two, also. ° °DIET: Your first meal following the procedure should be a light meal and then it is ok to progress to your normal diet. A half-sandwich or bowl of soup is an example of a good first meal. Heavy or fried foods are harder to digest and may make you feel nauseous or bloated. Drink plenty of fluids but you should avoid alcoholic beverages for 24 hours. If you had a esophageal dilation, please see attached instructions for diet.  ° °ACTIVITY: Your care partner should take you home directly after the procedure. You should plan to take it easy, moving slowly for the rest of the day. You can resume normal activity the day after the procedure however YOU SHOULD NOT DRIVE, use power tools, machinery or perform tasks that involve climbing or major physical exertion for 24 hours (because of the sedation medicines used during the test).  ° °SYMPTOMS TO REPORT IMMEDIATELY: °A gastroenterologist can be reached at any hour. Please call [phone]  for any of the following symptoms:  ° °Following upper endoscopy (EGD, EUS, ERCP, esophageal dilation) °Vomiting of blood or coffee ground material  °New, significant abdominal pain  °New, significant chest pain or pain under the shoulder blades  °Painful or persistently difficult swallowing  °New shortness of breath  °Black, tarry-looking or red, bloody stools ° °FOLLOW UP:  °If any biopsies were taken  you will be contacted by phone or by letter within the next 1-3 weeks. Call [phone]  if you have not heard about the biopsies in 3 weeks.  °Please also call with any specific questions about appointments or follow up tests. ° °

## 2016-03-07 NOTE — Anesthesia Postprocedure Evaluation (Signed)
Anesthesia Post Note  Patient: James Russell  Procedure(s) Performed: Procedure(s) (LRB): ESOPHAGOGASTRODUODENOSCOPY (EGD) WITH PROPOFOL (N/A) GASTRIC VARICES BANDING (N/A)  Patient location during evaluation: PACU Anesthesia Type: MAC Level of consciousness: awake and alert Pain management: pain level controlled Vital Signs Assessment: post-procedure vital signs reviewed and stable Respiratory status: spontaneous breathing, nonlabored ventilation, respiratory function stable and patient connected to nasal cannula oxygen Cardiovascular status: stable and blood pressure returned to baseline Anesthetic complications: no    Last Vitals:  Vitals:   03/07/16 1111 03/07/16 1321  BP: 133/85 (!) 144/81  Pulse: 95 84  Resp: 18 16  Temp: 36.8 C 36.8 C    Last Pain:  Vitals:   03/07/16 1321  TempSrc: Oral                 [name]

## 2016-03-07 NOTE — H&P (View-Only) (Signed)
Anesthesia Chart Review:  Pt is a same day work up.   Pt is a 55 year old male scheduled for EGD, gastric varices banding on 03/07/2016 with Teena Irani, MD.   - Cardiologist is Charolette Forward, MD, last office visit 09/08/15.   PMH includes:  CAD (s/p PCI/stent x 6 to CX 2007 and 2014), HTN, DM, colon cancer with liver mets.  Current smoker. BMI 25  Medications include: lipitor, lasix, nadolol, prilosec, ramipril.   Labs per GI.   1 view CXR 01/27/16: No active disease.  EKG 02/21/16: Sinus tachycardia (106 bpm). Abnormal R-wave progression, early transition.  Echo 09/13/15 (care everywhere): - LV size is normal, normal LV wall thickness. No segmental LV wall motion abnormalities. LV ejection fraction = 55-60%. LV filling pattern is impaired. - RV normal in size and function. - Mild aortic stenosis. Aortic valve peak pressure gradient is 16 mmHg. Aortic valve mean pressure gradient is 8 mmHg. There is no aortic regurgitation. - Trivial mitral valve thickening. There is trace mitral regurgitation. - Structurally normal tricuspid valve. Trace tricuspid regurgitation. - There is no pericardial effusion. - Probably no change in comparison with the prior study.  Cardiac cath 02/01/13 (care everywhere): 1.Two vessel coronary artery occlusive disease involving the LCx stents and non-dominant RCA. 2.Moderate focal disease of the distal LAD. 3.Preserved LVSF by Echo, EF = 60%. 4.Successful PCI and complex revascularization of the LCx and OMB 1 with placement of four Resolute DES's.  Pt underwent similar procedure 11/22 and 02/23/16.   If no changes, I anticipate pt can proceed with surgery as scheduled.   Willeen Cass, FNP-BC [facility] Stay Surgical [facility]/Anesthesiology Phone: [phone] 03/06/2016 4:17 PM

## 2016-03-07 NOTE — Transfer of Care (Signed)
Immediate Anesthesia Transfer of Care Note  Patient: James Russell  Procedure(s) Performed: Procedure(s): ESOPHAGOGASTRODUODENOSCOPY (EGD) WITH PROPOFOL (N/A) GASTRIC VARICES BANDING (N/A)  Patient Location: Endoscopy Unit  Anesthesia Type:MAC  Level of Consciousness: awake, oriented, sedated, patient cooperative and responds to stimulation  Airway & Oxygen Therapy: Patient Spontanous Breathing and Patient connected to nasal cannula oxygen  Post-op Assessment: Report given to RN, Post -op Vital signs reviewed and stable, Patient moving all extremities and Patient moving all extremities X 4  Post vital signs: Reviewed and stable  Last Vitals:  Vitals:   03/07/16 1111 03/07/16 1321  BP: 133/85 (!) 144/81  Pulse: 95 84  Resp: 18 16  Temp: 36.8 C     Last Pain:  Vitals:   03/07/16 1321  TempSrc: Oral         Complications: No apparent anesthesia complications

## 2016-03-07 NOTE — Interval H&P Note (Signed)
History and Physical Interval Note:  03/07/2016 12:41 PM  James Russell  has presented today for surgery, with the diagnosis of esoph. varicies  The various methods of treatment have been discussed with the patient and family. After consideration of risks, benefits and other options for treatment, the patient has consented to  Procedure(s): ESOPHAGOGASTRODUODENOSCOPY (EGD) WITH PROPOFOL (N/A) GASTRIC VARICES BANDING (N/A) as a surgical intervention .  The patient's history has been reviewed, patient examined, no change in status, stable for surgery.  I have reviewed the patient's chart and labs.  Questions were answered to the patient's satisfaction.     Giulietta Prokop C

## 2016-03-07 NOTE — Op Note (Signed)
[facility] Patient Name: James Russell Procedure Date : 03/07/2016 MRN: [id number] Attending MD: Missy Sabins , MD Date of Birth: May 06, 1960 CSN: [id number] Age: 55 Admit Type: Outpatient Procedure:                Upper GI endoscopy Indications:              For therapy of esophageal varices with bleeding Providers:                Jenny Reichmann C. Amedeo Plenty, MD, Cleda Daub, RN, Elspeth Cho Tech., Technician, Jacquiline Doe, CRNA Referring MD:              Medicines:                Propofol per Anesthesia Complications:            No immediate complications. Estimated Blood Loss:     Estimated blood loss was minimal. Procedure:                Pre-Anesthesia Assessment:                           - Prior to the procedure, a History and Physical                            was performed, and patient medications and                            allergies were reviewed. The patient's tolerance of                            previous anesthesia was also reviewed. The risks                            and benefits of the procedure and the sedation                            options and risks were discussed with the patient.                            All questions were answered, and informed consent                            was obtained. Prior Anticoagulants: The patient has                            taken no previous anticoagulant or antiplatelet                            agents. ASA Grade Assessment: III - A patient with                            severe systemic disease. After reviewing the risks  and benefits, the patient was deemed in                            satisfactory condition to undergo the procedure.                           After obtaining informed consent, the endoscope was                            passed under direct vision. Throughout the                            procedure, the patient's blood pressure, pulse, and                             oxygen saturations were monitored continuously. The                            [id number] scope was introduced through the                            mouth, and advanced to the second part of duodenum.                            The upper GI endoscopy was accomplished without                            difficulty. The patient tolerated the procedure                            well. Scope In: Scope Out: Findings:      Grade III varices were found in the middle third of the esophagus and in       the lower third of the esophagus. Three bands were successfully placed       with complete eradication, resulting in deflation of varices. There was       no bleeding during the procedure.      Three cratered esophageal ulcers with stigmata of prior treatment and no       bleeding and no stigmata of recent bleeding were found. The largest       lesion was 8 mm in largest dimension.      The entire examined stomach was normal.      The examined duodenum was normal. Impression:               - Grade III esophageal varices. Completely                            eradicated. Banded.                           - Non-bleeding esophageal ulcers.                           - Normal stomach.                           -  Normal examined duodenum.                           - No specimens collected. Moderate Sedation:      no moderate sedation Recommendation:           - Resume previous diet.                           - Continue present medications.                           - Return to my office in 3 weeks. Procedure Code(s):        --- Professional ---                           [phone], Esophagogastroduodenoscopy, flexible,                            transoral; with band ligation of esophageal/gastric                            varices Diagnosis Code(s):        --- Professional ---                           K22.10, Ulcer of esophagus without bleeding                            I85.01, Esophageal varices with bleeding CPT copyright 2016 [employer]. All rights reserved. The codes documented in this report are preliminary and upon coder review may  be revised to meet current compliance requirements. Missy Sabins, MD 03/07/2016 1:33:02 PM This report has been signed electronically. Number of Addenda: 0

## 2016-03-07 NOTE — Anesthesia Preprocedure Evaluation (Signed)
Anesthesia Evaluation  Patient identified by MRN, date of birth, ID band Patient awake    Reviewed: Allergy & Precautions, NPO status , Patient's Chart, lab work & pertinent test results  Airway Mallampati: II  TM Distance: >3 FB Neck ROM: Full    Dental no notable dental hx.    Pulmonary neg pulmonary ROS, Current Smoker,    Pulmonary exam normal breath sounds clear to auscultation       Cardiovascular hypertension, + CAD, + Past MI and + Cardiac Stents  Normal cardiovascular exam Rhythm:Regular Rate:Normal     Neuro/Psych negative neurological ROS  negative psych ROS   GI/Hepatic negative GI ROS, Neg liver ROS,   Endo/Other  diabetes  Renal/GU negative Renal ROS  negative genitourinary   Musculoskeletal negative musculoskeletal ROS (+)   Abdominal   Peds negative pediatric ROS (+)  Hematology  (+) anemia , thrombocytopenia   Anesthesia Other Findings   Reproductive/Obstetrics negative OB ROS                             Anesthesia Physical Anesthesia Plan  ASA: III  Anesthesia Plan: MAC   Post-op Pain Management:    Induction: Intravenous  Airway Management Planned: Nasal Cannula  Additional Equipment:   Intra-op Plan:   Post-operative Plan:   Informed Consent: I have reviewed the patients History and Physical, chart, labs and discussed the procedure including the risks, benefits and alternatives for the proposed anesthesia with the patient or authorized representative who has indicated his/her understanding and acceptance.   Dental advisory given  Plan Discussed with: CRNA and Surgeon  Anesthesia Plan Comments:         Anesthesia Quick Evaluation

## 2016-03-08 ENCOUNTER — Encounter (HOSPITAL_COMMUNITY): Payer: Self-pay | Admitting: Gastroenterology

## 2016-03-12 ENCOUNTER — Encounter (HOSPITAL_BASED_OUTPATIENT_CLINIC_OR_DEPARTMENT_OTHER): Payer: Self-pay

## 2016-03-12 ENCOUNTER — Emergency Department (HOSPITAL_BASED_OUTPATIENT_CLINIC_OR_DEPARTMENT_OTHER)
Admission: EM | Admit: 2016-03-12 | Discharge: 2016-03-12 | Disposition: A | Discharge status: Home / Self Care | Payer: PRIVATE HEALTH INSURANCE | Attending: Emergency Medicine | Admitting: Emergency Medicine

## 2016-03-12 ENCOUNTER — Emergency Department (HOSPITAL_BASED_OUTPATIENT_CLINIC_OR_DEPARTMENT_OTHER): Payer: PRIVATE HEALTH INSURANCE

## 2016-03-12 DIAGNOSIS — I10 Essential (primary) hypertension: Secondary | ICD-10-CM | POA: Diagnosis not present

## 2016-03-12 DIAGNOSIS — R109 Unspecified abdominal pain: Secondary | ICD-10-CM

## 2016-03-12 DIAGNOSIS — E860 Dehydration: Secondary | ICD-10-CM

## 2016-03-12 DIAGNOSIS — E119 Type 2 diabetes mellitus without complications: Secondary | ICD-10-CM | POA: Diagnosis not present

## 2016-03-12 DIAGNOSIS — Z8505 Personal history of malignant neoplasm of liver: Secondary | ICD-10-CM | POA: Diagnosis not present

## 2016-03-12 DIAGNOSIS — K5900 Constipation, unspecified: Secondary | ICD-10-CM

## 2016-03-12 DIAGNOSIS — Z85038 Personal history of other malignant neoplasm of large intestine: Secondary | ICD-10-CM | POA: Diagnosis not present

## 2016-03-12 DIAGNOSIS — R1033 Periumbilical pain: Secondary | ICD-10-CM | POA: Diagnosis present

## 2016-03-12 DIAGNOSIS — Z79899 Other long term (current) drug therapy: Secondary | ICD-10-CM | POA: Insufficient documentation

## 2016-03-12 DIAGNOSIS — F17200 Nicotine dependence, unspecified, uncomplicated: Secondary | ICD-10-CM | POA: Diagnosis not present

## 2016-03-12 DIAGNOSIS — I252 Old myocardial infarction: Secondary | ICD-10-CM | POA: Insufficient documentation

## 2016-03-12 LAB — COMPREHENSIVE METABOLIC PANEL
ALK PHOS: 177 U/L — AB (ref 38–126)
ALT: 23 U/L (ref 17–63)
ANION GAP: 8 (ref 5–15)
AST: 42 U/L — ABNORMAL HIGH (ref 15–41)
Albumin: 3.1 g/dL — ABNORMAL LOW (ref 3.5–5.0)
BUN: 12 mg/dL (ref 6–20)
CALCIUM: 8.7 mg/dL — AB (ref 8.9–10.3)
CO2: 22 mmol/L (ref 22–32)
Chloride: 104 mmol/L (ref 101–111)
Creatinine, Ser: 0.68 mg/dL (ref 0.61–1.24)
GFR calc non Af Amer: >60 mL/min (ref >60)
GLUCOSE: 140 mg/dL — AB (ref 65–99)
Potassium: 4.3 mmol/L (ref 3.5–5.1)
SODIUM: 134 mmol/L — AB (ref 135–145)
TOTAL PROTEIN: 6.6 g/dL (ref 6.5–8.1)
Total Bilirubin: 2.2 mg/dL — ABNORMAL HIGH (ref 0.3–1.2)

## 2016-03-12 LAB — URINALYSIS, MICROSCOPIC (REFLEX)

## 2016-03-12 LAB — URINALYSIS, ROUTINE W REFLEX MICROSCOPIC
GLUCOSE, UA: NEGATIVE mg/dL
HGB URINE DIPSTICK: NEGATIVE
Ketones, ur: 15 mg/dL — AB
Nitrite: NEGATIVE
PH: 6 (ref 5.0–8.0)
Protein, ur: NEGATIVE mg/dL
Specific Gravity, Urine: 1.025 (ref 1.005–1.030)

## 2016-03-12 LAB — CBC WITH DIFFERENTIAL/PLATELET
BASOS PCT: 0 %
Basophils Absolute: 0 10*3/uL (ref 0.0–0.1)
EOS PCT: 1 %
Eosinophils Absolute: 0.1 10*3/uL (ref 0.0–0.7)
HEMATOCRIT: 31.6 % — AB (ref 39.0–52.0)
HEMOGLOBIN: 9.8 g/dL — AB (ref 13.0–17.0)
LYMPHS PCT: 7 %
Lymphs Abs: 0.5 10*3/uL — ABNORMAL LOW (ref 0.7–4.0)
MCH: 23.8 pg — AB (ref 26.0–34.0)
MCHC: 31 g/dL (ref 30.0–36.0)
MCV: 76.9 fL — ABNORMAL LOW (ref 78.0–100.0)
MONOS PCT: 9 %
Monocytes Absolute: 0.6 10*3/uL (ref 0.1–1.0)
NEUTROS ABS: 5.7 10*3/uL (ref 1.7–7.7)
NEUTROS PCT: 83 %
Platelets: 114 10*3/uL — ABNORMAL LOW (ref 150–400)
RBC: 4.11 MIL/uL — ABNORMAL LOW (ref 4.22–5.81)
RDW: 19.2 % — ABNORMAL HIGH (ref 11.5–15.5)
WBC: 6.9 10*3/uL (ref 4.0–10.5)

## 2016-03-12 LAB — I-STAT CG4 LACTIC ACID, ED
LACTIC ACID, VENOUS: 1.94 mmol/L — AB (ref 0.5–1.9)
Lactic Acid, Venous: 2.37 mmol/L (ref 0.5–1.9)

## 2016-03-12 LAB — LIPASE, BLOOD: LIPASE: 28 U/L (ref 11–51)

## 2016-03-12 MED ORDER — SODIUM CHLORIDE 0.9 % IV BOLUS (SEPSIS)
1000.0000 mL | Freq: Once | INTRAVENOUS | Status: AC
  Administered 2016-03-12: 1000 mL via INTRAVENOUS

## 2016-03-12 MED ORDER — IOPAMIDOL (ISOVUE-300) INJECTION 61%
100.0000 mL | Freq: Once | INTRAVENOUS | Status: AC | PRN
  Administered 2016-03-12: 100 mL via INTRAVENOUS

## 2016-03-12 MED ORDER — ONDANSETRON HCL 4 MG/2ML IJ SOLN
4.0000 mg | Freq: Once | INTRAMUSCULAR | Status: AC
  Administered 2016-03-12: 4 mg via INTRAVENOUS
  Filled 2016-03-12: qty 2

## 2016-03-12 MED ORDER — OXYCODONE HCL 5 MG PO TABS: 5.0000 mg | ORAL_TABLET | ORAL | 0 refills | Status: AC | PRN

## 2016-03-12 MED ORDER — HYDROMORPHONE HCL 1 MG/ML IJ SOLN
1.0000 mg | Freq: Once | INTRAMUSCULAR | Status: AC
  Administered 2016-03-12: 1 mg via INTRAVENOUS
  Filled 2016-03-12: qty 1

## 2016-03-12 MED FILL — oxyCODONE HCL 5 MG TABS: 5 | 2 days supply | Qty: 15 | Fill #0

## 2016-03-12 NOTE — Discharge Instructions (Signed)
Please follow-up with your PCP and oncologist for further management. Please call them to be seen in the next 24-48 hours. If any symptoms return or worsen, please return to the nearest emergency department. Please take your pain medicine for symptoms and usual home nausea medicine for nausea. If you cannot maintain hydration or have worsening pain, please go to the ED.

## 2016-03-12 NOTE — ED Provider Notes (Signed)
[facility] Provider Note   CSN: [id number] Arrival date & time: 03/12/16  1145     History   Chief Complaint Chief Complaint  Patient presents with  . Constipation    HPI James Russell is a 55 y.o. male with a past medical history significant for colon cancer status post partial colectomy with metastasis to the liver and lungs, recent GI bleed, diabetes, hypertension, and CAD with MI who presents with abdominal pain, constipation, nausea, and vomiting. Patient reports that he was recently admitted for a GI bleed last week and was discharged. He reports that over the last few days, he has had some abdominal pain which she feels is associated with constipation. He says he is also had some nausea and vomiting over the last few days and has had decreased by mouth intake. He describes the pain is in his central abdomen, up to an 8 out of 10 severity, and feeling like a cramping/tightness. He denies any changes in his urination, he denies any dysuria, and he denies any fevers or chills. He denies any rhinorrhea, congestion, or cough. He denies any chest pain or shortness of breath. He is unsure of his abdomen feels larger than before. Patient reports that he has had slightly decreased flatus but had a bowel movement 3 days ago. He says that he uses MiraLAX for bowel movements. She reports that he is scheduled to start his chemotherapy again tonight. Patient reports feeling dehydrated.  The history is provided by the patient and medical records. No language interpreter was used.  Abdominal Pain   This is a new problem. The current episode started more than 2 days ago. The problem occurs constantly. The problem has not changed since onset.The pain is associated with a previous surgery (cancer). The pain is located in the periumbilical region. The quality of the pain is aching and cramping. The pain is at a severity of 8/10. The pain is moderate. Associated symptoms include nausea,  vomiting and constipation. Pertinent negatives include fever, belching, diarrhea, flatus, dysuria, frequency, hematuria and headaches. The symptoms are aggravated by palpation. Nothing relieves the symptoms. Past workup includes CT scan, ultrasound and surgery.    Past Medical History:  Diagnosis Date  . Cancer ([place])    liver  . Diabetes mellitus without complication ([place])    steroid induced  . GERD (gastroesophageal reflux disease)   . GI bleed    of gastric varices  . H. pylori infection   . Hypercholesteremia   . Hypertension   . Iron deficiency   . MI (myocardial infarction)     Patient Active Problem List   Diagnosis Date Noted  . Malnutrition of moderate degree 02/22/2016  . Fluid overload, unspecified 02/02/2016  . Thrombocytopenia ([place]) 01/31/2016  . Metastatic colon cancer to liver ([place]) 01/31/2016  . Peripheral neuropathy due to chemotherapy ([place]) 01/31/2016  . Left inguinal hernia 01/30/2016  . Hematemesis with nausea   . Acute hypoxemic respiratory failure ([place])   . Bleeding esophageal varices ([place])   . Acute encephalopathy   . Hypotension   . Acute blood loss anemia     Past Surgical History:  Procedure Laterality Date  . APPENDECTOMY    . CARDIAC CATHETERIZATION     01/2013  . COLECTOMY     open right  . coronary stents     6 total  . ESOPHAGOGASTRODUODENOSCOPY N/A 01/25/2016   Procedure: ESOPHAGOGASTRODUODENOSCOPY (EGD);  Surgeon: Teena Irani, MD;  Location: [facility];  Service: Endoscopy;  Laterality: N/A;  . ESOPHAGOGASTRODUODENOSCOPY N/A 02/21/2016   Procedure: ESOPHAGOGASTRODUODENOSCOPY (EGD);  Surgeon: Arta Silence, MD;  Location: [facility];  Service: Endoscopy;  Laterality: N/A;  . ESOPHAGOGASTRODUODENOSCOPY Left 02/23/2016   Procedure: ESOPHAGOGASTRODUODENOSCOPY (EGD);  Surgeon: Teena Irani, MD;  Location: [facility];  Service: Endoscopy;  Laterality: Left;  . ESOPHAGOGASTRODUODENOSCOPY (EGD) WITH PROPOFOL N/A 03/07/2016   Procedure:  ESOPHAGOGASTRODUODENOSCOPY (EGD) WITH PROPOFOL;  Surgeon: Teena Irani, MD;  Location: [facility];  Service: Endoscopy;  Laterality: N/A;  . GASTRIC VARICES BANDING N/A 03/07/2016   Procedure: GASTRIC VARICES BANDING;  Surgeon: Teena Irani, MD;  Location: [facility];  Service: Endoscopy;  Laterality: N/A;  . HERNIA REPAIR    . implanted port         Home Medications    Prior to Admission medications   Medication Sig Start Date End Date Taking? Authorizing Provider  ALPRAZolam Duanne Moron) 0.5 MG tablet Take 0.5 mg by mouth at bedtime as needed for anxiety.    Historical Provider, MD  atorvastatin (LIPITOR) 80 MG tablet Take 80 mg by mouth daily.    Historical Provider, MD  diclofenac sodium (VOLTAREN) 1 % GEL Apply 2 g topically 4 (four) times daily. To left groin 01/31/16   Debbe Odea, MD  docusate sodium (COLACE) 100 MG capsule Take 1 capsule (100 mg total) by mouth daily. 02/27/16   Donne Hazel, MD  furosemide (LASIX) 40 MG tablet Take 1 tablet (40 mg total) by mouth 2 (two) times daily. 02/02/16   Debbe Odea, MD  nadolol (CORGARD) 20 MG tablet Take 1 tablet (20 mg total) by mouth daily. 02/26/16   Donne Hazel, MD  nitroGLYCERIN (NITROSTAT) 0.4 MG SL tablet Place 0.4 mg under the tongue every 5 (five) minutes as needed for chest pain.    Historical Provider, MD  omeprazole (PRILOSEC) 40 MG capsule Take 40 mg by mouth daily.    Historical Provider, MD  ondansetron (ZOFRAN-ODT) 8 MG disintegrating tablet Take 8 mg by mouth every 8 (eight) hours as needed for nausea or vomiting.    Historical Provider, MD  oxyCODONE (OXY IR/ROXICODONE) 5 MG immediate release tablet Take 10 mg by mouth every 4 (four) hours as needed for severe pain.    Historical Provider, MD  pregabalin (LYRICA) 150 MG capsule Take 1 capsule (150 mg total) by mouth 3 (three) times daily. 01/31/16   Debbe Odea, MD  ramipril (ALTACE) 5 MG capsule Take 5 mg by mouth 2 (two) times daily.    Historical Provider, MD  zolpidem  (AMBIEN) 10 MG tablet Take 10 mg by mouth at bedtime as needed for sleep.    Historical Provider, MD    Family History Family History  Problem Relation Age of Onset  . Heart Problems Father     Social History Social History  Substance Use Topics  . Smoking status: Current Every Day Smoker  . Smokeless tobacco: Never Used  . Alcohol use No     Allergies   Januvia [sitagliptin]; Metformin and related; and Tylenol [acetaminophen]   Review of Systems Review of Systems  Constitutional: Negative for appetite change, chills, diaphoresis, fatigue and fever.  HENT: Negative for congestion and rhinorrhea.   Eyes: Negative for visual disturbance.  Respiratory: Negative for chest tightness, shortness of breath, wheezing and stridor.   Cardiovascular: Negative for chest pain and palpitations.  Gastrointestinal: Positive for abdominal pain, constipation, nausea and vomiting. Negative for anal bleeding, blood in stool, diarrhea and flatus.  Genitourinary: Negative for dysuria, flank pain,  frequency and hematuria.  Musculoskeletal: Negative for back pain, neck pain and neck stiffness.  Skin: Negative for rash and wound.  Neurological: Negative for weakness and headaches.  Psychiatric/Behavioral: Negative for agitation and confusion.  All other systems reviewed and are negative.    Physical Exam Updated Vital Signs BP 143/91 (BP Location: Left Arm)   Pulse 115   Temp 97.6 F (36.4 C) (Oral)   Resp 20   Ht 5\' 11"  (1.803 m)   Wt 179 lb (81.2 kg)   SpO2 100%   BMI 24.97 kg/m   Physical Exam  Constitutional: He is oriented to person, place, and time. He appears well-developed and well-nourished.  HENT:  Head: Normocephalic and atraumatic.  Mouth/Throat: Oropharynx is clear and moist. No oropharyngeal exudate.  Eyes: Conjunctivae and EOM are normal. Pupils are equal, round, and reactive to light.  Neck: Normal range of motion. Neck supple.  Cardiovascular: Normal rate, regular  rhythm, normal heart sounds and intact distal pulses.   No murmur heard. Pulmonary/Chest: Effort normal and breath sounds normal. No stridor. No respiratory distress. He has no wheezes. He has no rales. He exhibits no tenderness.  Abdominal: Soft. Bowel sounds are normal. He exhibits no distension and no mass. There is tenderness. There is no rebound and no guarding. No hernia.  Musculoskeletal: He exhibits no edema.  Neurological: He is alert and oriented to person, place, and time. He displays normal reflexes. No cranial nerve deficit or sensory deficit. Coordination normal.  Skin: Skin is warm and dry. Capillary refill takes less than 2 seconds. No pallor.  Psychiatric: He has a normal mood and affect.  Nursing note and vitals reviewed.    ED Treatments / Results  Labs (all labs ordered are listed, but only abnormal results are displayed) Labs Reviewed  CBC WITH DIFFERENTIAL/PLATELET - Abnormal; Notable for the following:       Result Value   RBC 4.11 (*)    Hemoglobin 9.8 (*)    HCT 31.6 (*)    MCV 76.9 (*)    MCH 23.8 (*)    RDW 19.2 (*)    Platelets 114 (*)    Lymphs Abs 0.5 (*)    All other components within normal limits  COMPREHENSIVE METABOLIC PANEL - Abnormal; Notable for the following:    Sodium 134 (*)    Glucose, Bld 140 (*)    Calcium 8.7 (*)    Albumin 3.1 (*)    AST 42 (*)    Alkaline Phosphatase 177 (*)    Total Bilirubin 2.2 (*)    All other components within normal limits  URINALYSIS, ROUTINE W REFLEX MICROSCOPIC - Abnormal; Notable for the following:    Color, Urine AMBER (*)    Bilirubin Urine SMALL (*)    Ketones, ur 15 (*)    Leukocytes, UA SMALL (*)    All other components within normal limits  URINALYSIS, MICROSCOPIC (REFLEX) - Abnormal; Notable for the following:    Bacteria, UA RARE (*)    Squamous Epithelial / LPF 0-5 (*)    All other components within normal limits  I-STAT CG4 LACTIC ACID, ED - Abnormal; Notable for the following:    Lactic  Acid, Venous 2.37 (*)    All other components within normal limits  I-STAT CG4 LACTIC ACID, ED - Abnormal; Notable for the following:    Lactic Acid, Venous 1.94 (*)    All other components within normal limits  LIPASE, BLOOD    EKG  EKG Interpretation  None       Radiology Dg Chest 2 View  Result Date: 03/12/2016 CLINICAL DATA:  History of metastatic colon carcinoma and cirrhosis. Hypertension. Pain and tachycardia EXAM: CHEST  2 VIEW COMPARISON:  January 27, 2016 FINDINGS: Port-A-Cath tip is at the cavoatrial junction. No pneumothorax. No edema or consolidation. Heart size and pulmonary vascularity are normal. No adenopathy. No evident bone lesions. IMPRESSION: No edema or consolidation. Electronically Signed   By: Lowella Grip III M.D.   On: 03/12/2016 14:03   Ct Abdomen Pelvis W Contrast  Result Date: 03/12/2016 CLINICAL DATA:  Reason GI bleed. Nausea vomiting for 3 days. History of colon cancer with metastatic liver disease. History of gastric varices banding. EXAM: CT ABDOMEN AND PELVIS WITH CONTRAST TECHNIQUE: Multidetector CT imaging of the abdomen and pelvis was performed using the standard protocol following bolus administration of intravenous contrast. CONTRAST:  156mL ISOVUE-300 IOPAMIDOL (ISOVUE-300) INJECTION 61% COMPARISON:  CT scan 04/28/2013 FINDINGS: Lower chest: There are numerous bilateral pulmonary nodules consistent with metastatic disease. Index lesion at the right lung base on image number 3 measures 7 mm. Index lesion in the left lower lobe on image number 11 measures 10 mm. The heart is normal in size. No pericardial effusion. Dense coronary artery calcifications are noted. The distal esophagus is grossly normal. No obvious varices. Hepatobiliary: Vague low-attenuation liver lesions concerning for metastatic disease. Largest lesion and segment 3 on image number 27 measures 18 mm. Other smaller lesions scattered in the liver. The liver contour is very irregular  and the right lobe is markedly atrophied. Findings concerning for cirrhosis. The gallbladder is moderately distended. No common bile duct dilatation. Pancreas: No mass, inflammation or ductal dilatation. Spleen: Splenomegaly is noted.  It measures 15 x 9 x 19 cm. Adrenals/Urinary Tract: New 11 mm right adrenal gland nodule worrisome for metastasis. The left adrenal gland is unremarkable. The kidneys are unremarkable. Stomach/Bowel: The stomach, duodenum, small bowel and colon are grossly normal. Surgical changes involving the right colon. No findings for obstruction or acute inflammation. Vascular/Lymphatic: Advanced atherosclerotic calcifications involving the aorta and iliac arteries with significant luminal narrowing of the distal aorta. The major venous structures are patent. The portal and splenic veins are patent. Portal venous collaterals are noted with varices around the umbilicus. Reproductive: The prostate gland and seminal vesicles are grossly normal. Other: Large amount of free abdominal and free pelvic fluid. No pelvic mass or adenopathy. There is a large amount of fluid and left inguinal hernia. 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 Advanced degenerative changes involving the left hip. IMPRESSION: 1. Numerous pulmonary metastatic lesions. 2. Several vague low-attenuation liver lesions consistent with metastatic disease. 3. Cirrhotic changes involving the liver with portal venous hypertension, portal venous collaterals, splenomegaly and large volume ascites. 4. New right adrenal gland lesions suspicious for metastasis. Electronically Signed   By: Marijo Sanes M.D.   On: 03/12/2016 14:22    Procedures Procedures (including critical care time)  Medications Ordered in ED Medications  HYDROmorphone (DILAUDID) injection 1 mg (1 mg Intravenous Given 03/12/16 1258)  ondansetron (ZOFRAN) injection 4 mg (4 mg Intravenous Given 03/12/16  1257)  sodium chloride 0.9 % bolus 1,000 mL (0 mLs Intravenous Stopped 03/12/16 1404)  sodium chloride 0.9 % bolus 1,000 mL (0 mLs Intravenous Stopped 03/12/16 1536)  iopamidol (ISOVUE-300) 61 % injection 100 mL (100 mLs Intravenous Contrast Given 03/12/16 1344)  sodium chloride 0.9 % bolus 1,000 mL (0 mLs Intravenous Stopped 03/12/16  1621)     Initial Impression / Assessment and Plan / ED Course  I have reviewed the triage vital signs and the nursing notes.  Pertinent labs & imaging results that were available during my care of the patient were reviewed by me and considered in my medical decision making (see chart for details).  Clinical Course     James Russell is a 55 y.o. male with a past medical history significant for colon cancer status post partial colectomy with metastasis to the liver and lungs, recent GI bleed, diabetes, hypertension, and CAD with MI who presents with abdominal pain, constipation, nausea, and vomiting.  History and exam are seen above.  On exam, patient had clear lungs. Patient's chest is nontender. Patient had very mild tenderness to palpation in the central abdomen with no lower abdominal tenderness. Patient had no CVA tenderness. Patient had no lower extreme of the pain or edema. Patient had no focal neurologic deficits and had a normal gait.  Given patient's report of abdominal pain, decrease in flatus, and decrease in bowel movement in the setting of cancer and prior surgery, CT was obtained of the abdomen. Patient also had laboratory testing to look for dehydration or occult infection. Patient given pain medicine, nausea medicine, and fluids.  Lab testing showed slightly lactic acid 2.37. After fluids, this decreased to 1.94. Patient had no leukocytosis and hemoglobin was improved from prior. CMP showed similar to prior. Nonelevated lipase. Urinalysis showed small leuks but no nitrites. There was mucus and squamous epithelial cells present with bacteria on  microscopic. Given lack of urinary symptoms, doubt UTI at this time.  CT showed increased cancer burden with lesions in the lungs, liver, and now adrenal gland. No evidence of obstruction or acute inflammation in the stomach and bowels.  Given the lack of obstruction or significant change in abdominal imaging and given patient's near resolution in symptoms with fluid and medicine, feel patient is likely stable for discharge. Given improvement in tenderness and lack of laboratory testing to support infection, doubt SBP. Review of prior ultrasound showed previous ascites. Do not feel patient has had grossly worsened ascites.  Shared decision-making conversation was held with patient about utility of admission versus discharge. With downtrending lactic and reassuring labs, patient was willing to discharge for outpatient managed. Patient requested addition of immediate release oxycodone for pain management. Patient will call his oncologist to discuss starting chemotherapy tonight versus tomorrow. Patient understood that he could be admitted for continued rehydration and observation however, he wanted to be discharged for outpatient management. Patient understood strict return precautions for any new or worsening symptoms including worsened constipation, worsened pain, or any signs of infection. Patient understood risks and had no other questions or concerns. Patient felt much better and was discharged.   Final Clinical Impressions(s) / ED Diagnoses   Final diagnoses:  Constipation, unspecified constipation type  Abdominal pain, unspecified abdominal location  Dehydration    New Prescriptions Discharge Medication List as of 03/12/2016  4:50 PM    START taking these medications   Details  !! oxyCODONE (ROXICODONE) 5 MG immediate release tablet Take 1 tablet (5 mg total) by mouth every 4 (four) hours as needed for severe pain., Starting Tue 03/12/2016, Print     !! - Potential duplicate medications  found. Please discuss with provider.      Clinical Impression: 1. Constipation, unspecified constipation type   2. Abdominal pain, unspecified abdominal location   3. Dehydration     Disposition: Discharge  Condition: Good  I have discussed the results, Dx and Tx plan with the pt(& family if present). He/she/they expressed understanding and agree(s) with the plan. Discharge instructions discussed at great length. Strict return precautions discussed and pt &/or family have verbalized understanding of the instructions. No further questions at time of discharge.    Discharge Medication List as of 03/12/2016  4:50 PM    START taking these medications   Details  !! oxyCODONE (ROXICODONE) 5 MG immediate release tablet Take 1 tablet (5 mg total) by mouth every 4 (four) hours as needed for severe pain., Starting Tue 03/12/2016, Print     !! - Potential duplicate medications found. Please discuss with provider.      Follow Up: [facility] [address] [id number] [place] [id number]  If symptoms worsen     Courtney Paris, MD 03/12/16 [phone]

## 2016-03-12 NOTE — ED Triage Notes (Signed)
C/o constipation x 3 days-n/v x today-recent admn to [facility] for GI bleed-states was d/c last week-NAD-slow steady gait

## 2016-03-12 NOTE — ED Notes (Signed)
Pt in CT.

## 2016-03-12 NOTE — ED Notes (Signed)
Patient ambulatory to restroom, no assistance required.

## 2016-03-22 ENCOUNTER — Encounter (HOSPITAL_BASED_OUTPATIENT_CLINIC_OR_DEPARTMENT_OTHER): Payer: Self-pay | Admitting: *Deleted

## 2016-03-22 ENCOUNTER — Emergency Department (HOSPITAL_BASED_OUTPATIENT_CLINIC_OR_DEPARTMENT_OTHER)
Admission: EM | Admit: 2016-03-22 | Discharge: 2016-03-22 | Disposition: A | Discharge status: Home / Self Care | Payer: PRIVATE HEALTH INSURANCE | Attending: Emergency Medicine | Admitting: Emergency Medicine

## 2016-03-22 ENCOUNTER — Emergency Department (HOSPITAL_BASED_OUTPATIENT_CLINIC_OR_DEPARTMENT_OTHER): Payer: PRIVATE HEALTH INSURANCE

## 2016-03-22 DIAGNOSIS — R1030 Lower abdominal pain, unspecified: Secondary | ICD-10-CM | POA: Diagnosis not present

## 2016-03-22 DIAGNOSIS — F17200 Nicotine dependence, unspecified, uncomplicated: Secondary | ICD-10-CM | POA: Diagnosis not present

## 2016-03-22 DIAGNOSIS — E119 Type 2 diabetes mellitus without complications: Secondary | ICD-10-CM | POA: Diagnosis not present

## 2016-03-22 DIAGNOSIS — R140 Abdominal distension (gaseous): Secondary | ICD-10-CM | POA: Insufficient documentation

## 2016-03-22 DIAGNOSIS — Z8505 Personal history of malignant neoplasm of liver: Secondary | ICD-10-CM | POA: Insufficient documentation

## 2016-03-22 DIAGNOSIS — R110 Nausea: Secondary | ICD-10-CM | POA: Diagnosis not present

## 2016-03-22 DIAGNOSIS — I10 Essential (primary) hypertension: Secondary | ICD-10-CM | POA: Insufficient documentation

## 2016-03-22 DIAGNOSIS — Z79899 Other long term (current) drug therapy: Secondary | ICD-10-CM | POA: Diagnosis not present

## 2016-03-22 LAB — URINALYSIS, ROUTINE W REFLEX MICROSCOPIC
GLUCOSE, UA: NEGATIVE mg/dL
HGB URINE DIPSTICK: NEGATIVE
KETONES UR: NEGATIVE mg/dL
Leukocytes, UA: NEGATIVE
Nitrite: NEGATIVE
PH: 5.5 (ref 5.0–8.0)
PROTEIN: NEGATIVE mg/dL
Specific Gravity, Urine: 1.018 (ref 1.005–1.030)

## 2016-03-22 LAB — COMPREHENSIVE METABOLIC PANEL
ALT: 20 U/L (ref 17–63)
AST: 38 U/L (ref 15–41)
Albumin: 2.6 g/dL — ABNORMAL LOW (ref 3.5–5.0)
Alkaline Phosphatase: 147 U/L — ABNORMAL HIGH (ref 38–126)
Anion gap: 7 (ref 5–15)
BILIRUBIN TOTAL: 1.7 mg/dL — AB (ref 0.3–1.2)
BUN: 15 mg/dL (ref 6–20)
CO2: 23 mmol/L (ref 22–32)
CREATININE: 0.61 mg/dL (ref 0.61–1.24)
Calcium: 8.3 mg/dL — ABNORMAL LOW (ref 8.9–10.3)
Chloride: 103 mmol/L (ref 101–111)
GFR calc Af Amer: >60 mL/min (ref >60)
GLUCOSE: 141 mg/dL — AB (ref 65–99)
Potassium: 4.4 mmol/L (ref 3.5–5.1)
Sodium: 133 mmol/L — ABNORMAL LOW (ref 135–145)
TOTAL PROTEIN: 5.9 g/dL — AB (ref 6.5–8.1)

## 2016-03-22 LAB — CBC WITH DIFFERENTIAL/PLATELET
BASOS ABS: 0 10*3/uL (ref 0.0–0.1)
Basophils Relative: 1 %
Eosinophils Absolute: 0.1 10*3/uL (ref 0.0–0.7)
Eosinophils Relative: 1 %
HEMATOCRIT: 30.6 % — AB (ref 39.0–52.0)
HEMOGLOBIN: 9.5 g/dL — AB (ref 13.0–17.0)
LYMPHS PCT: 8 %
Lymphs Abs: 0.7 10*3/uL (ref 0.7–4.0)
MCH: 23.5 pg — ABNORMAL LOW (ref 26.0–34.0)
MCHC: 31 g/dL (ref 30.0–36.0)
MCV: 75.6 fL — AB (ref 78.0–100.0)
Monocytes Absolute: 0.9 10*3/uL (ref 0.1–1.0)
Monocytes Relative: 11 %
NEUTROS ABS: 6.6 10*3/uL (ref 1.7–7.7)
NEUTROS PCT: 79 %
Platelets: 153 10*3/uL (ref 150–400)
RBC: 4.05 MIL/uL — AB (ref 4.22–5.81)
RDW: 19.6 % — ABNORMAL HIGH (ref 11.5–15.5)
WBC: 8.3 10*3/uL (ref 4.0–10.5)

## 2016-03-22 LAB — LIPASE, BLOOD: LIPASE: 23 U/L (ref 11–51)

## 2016-03-22 MED ORDER — SODIUM CHLORIDE 0.9 % IV BOLUS (SEPSIS)
1000.0000 mL | Freq: Once | INTRAVENOUS | Status: AC
  Administered 2016-03-22: 1000 mL via INTRAVENOUS

## 2016-03-22 MED ORDER — HEPARIN SOD (PORK) LOCK FLUSH 100 UNIT/ML IV SOLN
INTRAVENOUS | Status: AC
  Filled 2016-03-22: qty 5

## 2016-03-22 MED ORDER — HYDROMORPHONE HCL 1 MG/ML IJ SOLN
1.0000 mg | Freq: Once | INTRAMUSCULAR | Status: AC
  Administered 2016-03-22: 1 mg via INTRAVENOUS
  Filled 2016-03-22: qty 1

## 2016-03-22 MED ORDER — IOPAMIDOL (ISOVUE-300) INJECTION 61%
100.0000 mL | Freq: Once | INTRAVENOUS | Status: AC | PRN
  Administered 2016-03-22: 100 mL via INTRAVENOUS

## 2016-03-22 MED ORDER — ONDANSETRON HCL 4 MG/2ML IJ SOLN
4.0000 mg | Freq: Once | INTRAMUSCULAR | Status: AC
  Administered 2016-03-22: 4 mg via INTRAVENOUS
  Filled 2016-03-22: qty 2

## 2016-03-22 NOTE — ED Provider Notes (Signed)
[facility] Provider Note   CSN: [id number] Arrival date & time: 03/22/16  1706  By signing my name below, I, James Russell, attest that this documentation has been prepared under the direction and in the presence of physician practitioner, Sherwood Gambler, MD. Electronically Signed: Dora Russell, Scribe. 03/22/2016. 6:19 PM.  History   Chief Complaint Chief Complaint  Patient presents with  . Abdominal Pain    The history is provided by the patient. No language interpreter was used.     HPI Comments: James Russell is a 55 y.o. male with PMHx significant for liver cancer, GERD, DM, HTN, and HLD who presents to the Emergency Department complaining of intermittent, waxing and waning, lower abdominal pain for a couple of weeks. He notes he was seen here two weeks ago for the same; he had a CT scan which was negative. He notes his abdominal pain worsened severely last night. He reports associated nausea, dry heaving and abdominal distension but specifically denies vomiting. Pt states he had a bowel movement last night with transient relief of his abdominal pain. He notes he has a left inguinal hernia currently but no acute change in the associated pain. Pt reports he has had constipation in the past due to chemotherapy, but takes Miralax and stool softeners on a regular basis and has not experienced constipation lately. Pt notes a pertinent PSHx of abdominal hernia repair, colectomy, and appendectomy. He denies CP, SOB, testicular pain, fever, chills, dysuria, hematuria, hematochezia, or any other associated symptoms.  Past Medical History:  Diagnosis Date  . Cancer ([place])    liver  . 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 GERD (gastroesophageal reflux disease)   . GI bleed    of gastric varices  . H. pylori infection   . Hypercholesteremia   . Hypertension   . Iron deficiency   . MI (myocardial infarction)     Patient Active Problem List     Diagnosis Date Noted  . Malnutrition of moderate degree 02/22/2016  . Fluid overload, unspecified 02/02/2016  . Thrombocytopenia ([place]) 01/31/2016  . Metastatic colon cancer to liver ([place]) 01/31/2016  . Peripheral neuropathy due to chemotherapy ([place]) 01/31/2016  . Left inguinal hernia 01/30/2016  . Hematemesis with nausea   . Acute hypoxemic respiratory failure ([place])   . Bleeding esophageal varices ([place])   . Acute encephalopathy   . Hypotension   . Acute blood loss anemia     Past Surgical History:  Procedure Laterality Date  . APPENDECTOMY    . CARDIAC CATHETERIZATION     01/2013  . COLECTOMY     open right  . coronary stents     6 total  . ESOPHAGOGASTRODUODENOSCOPY N/A 01/25/2016   Procedure: ESOPHAGOGASTRODUODENOSCOPY (EGD);  Surgeon: Teena Irani, MD;  Location: [facility];  Service: Endoscopy;  Laterality: N/A;  . ESOPHAGOGASTRODUODENOSCOPY N/A 02/21/2016   Procedure: ESOPHAGOGASTRODUODENOSCOPY (EGD);  Surgeon: Arta Silence, MD;  Location: [facility];  Service: Endoscopy;  Laterality: N/A;  . ESOPHAGOGASTRODUODENOSCOPY Left 02/23/2016   Procedure: ESOPHAGOGASTRODUODENOSCOPY (EGD);  Surgeon: Teena Irani, MD;  Location: [facility];  Service: Endoscopy;  Laterality: Left;  . ESOPHAGOGASTRODUODENOSCOPY (EGD) WITH PROPOFOL N/A 03/07/2016   Procedure: ESOPHAGOGASTRODUODENOSCOPY (EGD) WITH PROPOFOL;  Surgeon: Teena Irani, MD;  Location: [facility];  Service: Endoscopy;  Laterality: N/A;  . GASTRIC VARICES BANDING N/A 03/07/2016   Procedure: GASTRIC VARICES BANDING;  Surgeon: Teena Irani, MD;  Location: [facility];  Service: Endoscopy;  Laterality: N/A;  .  HERNIA REPAIR    . implanted port         Home Medications    Prior to Admission medications   Medication Sig Start Date End Date Taking? Authorizing Provider  promethazine (PHENERGAN) 25 MG tablet Take 25 mg by mouth every 6 (six) hours as needed for nausea or vomiting.   Yes Historical Provider, MD  ALPRAZolam  Duanne Moron) 0.5 MG tablet Take 0.5 mg by mouth at bedtime as needed for anxiety.    Historical Provider, MD  atorvastatin (LIPITOR) 80 MG tablet Take 80 mg by mouth daily.    Historical Provider, MD  diclofenac sodium (VOLTAREN) 1 % GEL Apply 2 g topically 4 (four) times daily. To left groin 01/31/16   Debbe Odea, MD  docusate sodium (COLACE) 100 MG capsule Take 1 capsule (100 mg total) by mouth daily. 02/27/16   Donne Hazel, MD  furosemide (LASIX) 40 MG tablet Take 1 tablet (40 mg total) by mouth 2 (two) times daily. 02/02/16   Debbe Odea, MD  nadolol (CORGARD) 20 MG tablet Take 1 tablet (20 mg total) by mouth daily. 02/26/16   Donne Hazel, MD  nitroGLYCERIN (NITROSTAT) 0.4 MG SL tablet Place 0.4 mg under the tongue every 5 (five) minutes as needed for chest pain.    Historical Provider, MD  omeprazole (PRILOSEC) 40 MG capsule Take 40 mg by mouth daily.    Historical Provider, MD  ondansetron (ZOFRAN-ODT) 8 MG disintegrating tablet Take 8 mg by mouth every 8 (eight) hours as needed for nausea or vomiting.    Historical Provider, MD  oxyCODONE (OXY IR/ROXICODONE) 5 MG immediate release tablet Take 10 mg by mouth every 4 (four) hours as needed for severe pain.    Historical Provider, MD  oxyCODONE (ROXICODONE) 5 MG immediate release tablet Take 1 tablet (5 mg total) by mouth every 4 (four) hours as needed for severe pain. 03/12/16   Gwenyth Allegra Tegeler, MD  pregabalin (LYRICA) 150 MG capsule Take 1 capsule (150 mg total) by mouth 3 (three) times daily. 01/31/16   Debbe Odea, MD  ramipril (ALTACE) 5 MG capsule Take 5 mg by mouth 2 (two) times daily.    Historical Provider, MD  zolpidem (AMBIEN) 10 MG tablet Take 10 mg by mouth at bedtime as needed for sleep.    Historical Provider, MD    Family History Family History  Problem Relation Age of Onset  . Heart Problems Father     Social History Social History  Substance Use Topics  . Smoking status: Current Every Day Smoker  . Smokeless  tobacco: Never Used  . Alcohol use No     Allergies   Januvia [sitagliptin]; Metformin and related; Motrin [ibuprofen]; and Tylenol [acetaminophen]   Review of Systems Review of Systems  Constitutional: Negative for chills and fever.  Respiratory: Negative for shortness of breath.   Cardiovascular: Negative for chest pain.  Gastrointestinal: Positive for abdominal distention, abdominal pain and nausea. Negative for blood in stool and vomiting.  Genitourinary: Negative for dysuria, hematuria and testicular pain.  All other systems reviewed and are negative.    Physical Exam Updated Vital Signs BP 112/73 (BP Location: Right Arm)   Pulse 78   Temp 98 F (36.7 C)   Resp 16   Ht 5\' 11"  (1.803 m)   Wt 179 lb (81.2 kg)   SpO2 99%   BMI 24.97 kg/m   Physical Exam  Constitutional: He is oriented to person, place, and time. He appears well-developed  and well-nourished.  HENT:  Head: Normocephalic and atraumatic.  Right Ear: External ear normal.  Left Ear: External ear normal.  Nose: Nose normal.  Eyes: Right eye exhibits no discharge. Left eye exhibits no discharge.  Neck: Neck supple.  Cardiovascular: Normal rate, regular rhythm and normal heart sounds.   Pulmonary/Chest: Effort normal and breath sounds normal.  Abdominal: Soft. He exhibits distension. There is no tenderness.  Lower abdominal distension with firmness. No obvious abdominal wall hernia. Left inguinal hernia is soft and easily reducible.  Genitourinary:  Genitourinary Comments: Normal rectal exam with no obvious mass or impaction. No gross blood.  Musculoskeletal: He exhibits no edema.  Neurological: He is alert and oriented to person, place, and time.  Skin: Skin is warm and dry.  Nursing note and vitals reviewed.    ED Treatments / Results  Labs (all labs ordered are listed, but only abnormal results are displayed) Labs Reviewed  CBC WITH DIFFERENTIAL/PLATELET - Abnormal; Notable for the following:         Result Value   RBC 4.05 (*)    Hemoglobin 9.5 (*)    HCT 30.6 (*)    MCV 75.6 (*)    MCH 23.5 (*)    RDW 19.6 (*)    All other components within normal limits  COMPREHENSIVE METABOLIC PANEL - Abnormal; Notable for the following:    Sodium 133 (*)    Glucose, Bld 141 (*)    Calcium 8.3 (*)    Total Protein 5.9 (*)    Albumin 2.6 (*)    Alkaline Phosphatase 147 (*)    Total Bilirubin 1.7 (*)    All other components within normal limits  URINALYSIS, ROUTINE W REFLEX MICROSCOPIC - Abnormal; Notable for the following:    Color, Urine AMBER (*)    Bilirubin Urine SMALL (*)    All other components within normal limits  LIPASE, BLOOD    EKG  EKG Interpretation None       Radiology Ct Abdomen Pelvis W Contrast  Result Date: 03/22/2016 CLINICAL DATA:  Abdominal pain and vomiting for 1 day, colon cancer, receiving chemotherapy, worsening abdominal pain and distention since prior CT scan. History hypertension, coronary artery disease post MI, diabetes mellitus EXAM: CT ABDOMEN AND PELVIS WITH CONTRAST TECHNIQUE: Multidetector CT imaging of the abdomen and pelvis was performed using the standard protocol following bolus administration of intravenous contrast. CONTRAST:  196mL ISOVUE-300 IOPAMIDOL (ISOVUE-300) INJECTION 61% IV. Dilute oral contrast. COMPARISON:  03/12/2016 FINDINGS: Lower chest: Subsegmental atelectasis RIGHT lower lobe. Bibasilar pulmonary nodules compatible with metastases, not significantly changed since recent prior exam. Hepatobiliary: Nodular hepatic contours small liver size suggests cirrhosis. Small exophytic nodules along the surface liver cannot exclude metastases. Additionally, vague low-attenuation foci seen within the liver concerning for hepatic metastases, largest 18 x 11 mm lateral segment LEFT lobe image 32. Distended gallbladder without calcification. Pancreas: Unremarkable Spleen: Appears enlarged, 18.0 x 6.8 x 13.8 cm (volume = 880 cm3). No focal  lesions. Adrenals/Urinary Tract: Kidneys, ureters, and bladder normal appearance. Slight nodularity of both adrenal glands question small adrenal metastases. Mild prostatic enlargement. Stomach/Bowel: Prior ileocolic resection. Few sigmoid diverticula. Stomach decompressed. Colon under distended, unable to exclude wall thickening at the hepatic flexure region of this could be an artifact from incomplete distention. Small bowel loops grossly unremarkable. Vascular/Lymphatic: Atherosclerotic calcifications aorta without aneurysm. No definite adenopathy. Scattered old collaterals in the RIGHT upper quadrant extending to the umbilicus. Reproductive: N/A Other: Extensive ascites. Extension of ascites in the LEFT  inguinal hernia. Several small ventral hernias containing ascites and fat. Area of low-attenuation fat with surrounding margin at the lateral RIGHT mid abdomen likely resection bed from ascending colectomy. Musculoskeletal: Bones demineralized. Degenerative disc disease changes L5-S1. Mixed lytic and sclerotic process of the LEFT hemipelvis question Paget's disease. IMPRESSION: Suspect cirrhotic liver with upper abdominal and abdominal wall collaterals as well as splenomegaly. Suspected hepatic and potentially small adrenal metastases. Multiple lung base metastases bilaterally. Increased ascites since previous exam. LEFT inguinal hernia and small ventral hernias containing fat and fluid. Aortic atherosclerosis and coronary arterial calcifications. Unable to exclude segmental colonic wall thickening at the hepatic flexure of the colon though this could be potentially artifactual related to underdistention. Distended gallbladder. Electronically Signed   By: Lavonia Dana M.D.   On: 03/22/2016 21:04    Procedures Procedures (including critical care time)  DIAGNOSTIC STUDIES: Oxygen Saturation is 98% on RA, normal by my interpretation.    COORDINATION OF CARE: 6:34 PM Discussed treatment plan with pt at  bedside and pt agreed to plan.  Medications Ordered in ED Medications  heparin lock flush 100 UNIT/ML injection (not administered)  sodium chloride 0.9 % bolus 1,000 mL (0 mLs Intravenous Stopped 03/22/16 2044)  ondansetron (ZOFRAN) injection 4 mg (4 mg Intravenous Given 03/22/16 1912)  HYDROmorphone (DILAUDID) injection 1 mg (1 mg Intravenous Given 03/22/16 1912)  iopamidol (ISOVUE-300) 61 % injection 100 mL (100 mLs Intravenous Contrast Given 03/22/16 2021)  HYDROmorphone (DILAUDID) injection 1 mg (1 mg Intravenous Given 03/22/16 2105)     Initial Impression / Assessment and Plan / ED Course  I have reviewed the triage vital signs and the nursing notes.  Pertinent labs & imaging results that were available during my care of the patient were reviewed by me and considered in my medical decision making (see chart for details).  Clinical Course as of Mar 24 55  Fri Mar 22, 2016  1835 CT, iv pain/nausea meds, labs. No obvious incarcerated hernia at this time  [SG]    Clinical Course User Index [SG] Sherwood Gambler, MD    Patient's pain is better. No vomiting. No ill appearance, VS, stable. Labs benign and stable. WBC normal. Has some mildly worse ascites but CT essentially unchanged. D/w Dr. Penelope Coop, GI, who recommends no further treatment except pain control and f/u with his oncologist. Highly doubt acute infection. I believe overall patient's cancer is worsening and this is likely causing the increased pain. Has enough oxycodone at home. No signs of incarcerated hernia. D/c home with return precautions and onc follow up  Final Clinical Impressions(s) / ED Diagnoses   Final diagnoses:  Lower abdominal pain    New Prescriptions Discharge Medication List as of 03/22/2016 11:04 PM     I personally performed the services described in this documentation, which was scribed in my presence. The recorded information has been reviewed and is accurate.    Sherwood Gambler, MD 03/23/16  [phone]

## 2016-03-22 NOTE — ED Notes (Signed)
ED Provider at bedside. 

## 2016-03-22 NOTE — ED Triage Notes (Signed)
Pt c/o abd pain and vomiting x 1 day HX colon ca receiving chemo

## 2016-03-22 NOTE — ED Notes (Signed)
Family at bedside. 

## 2016-03-22 NOTE — ED Notes (Signed)
Patient transported to CT 

## 2016-03-22 NOTE — ED Notes (Signed)
Paged physician on call for GI consult via [phone] @ 9:51 pm

## 2016-05-02 DEATH — deceased

## 2017-04-24 IMAGING — DX DG ABD PORTABLE 1V
2 series · 2 of 2 positions shown · non-contrast
Comparison: CT abdomen and pelvis April 28, 2013

CLINICAL DATA: Abdominal pain nausea and vomiting

EXAM:
PORTABLE ABDOMEN - 1 VIEW

[abdomen kub (1 of 2)]
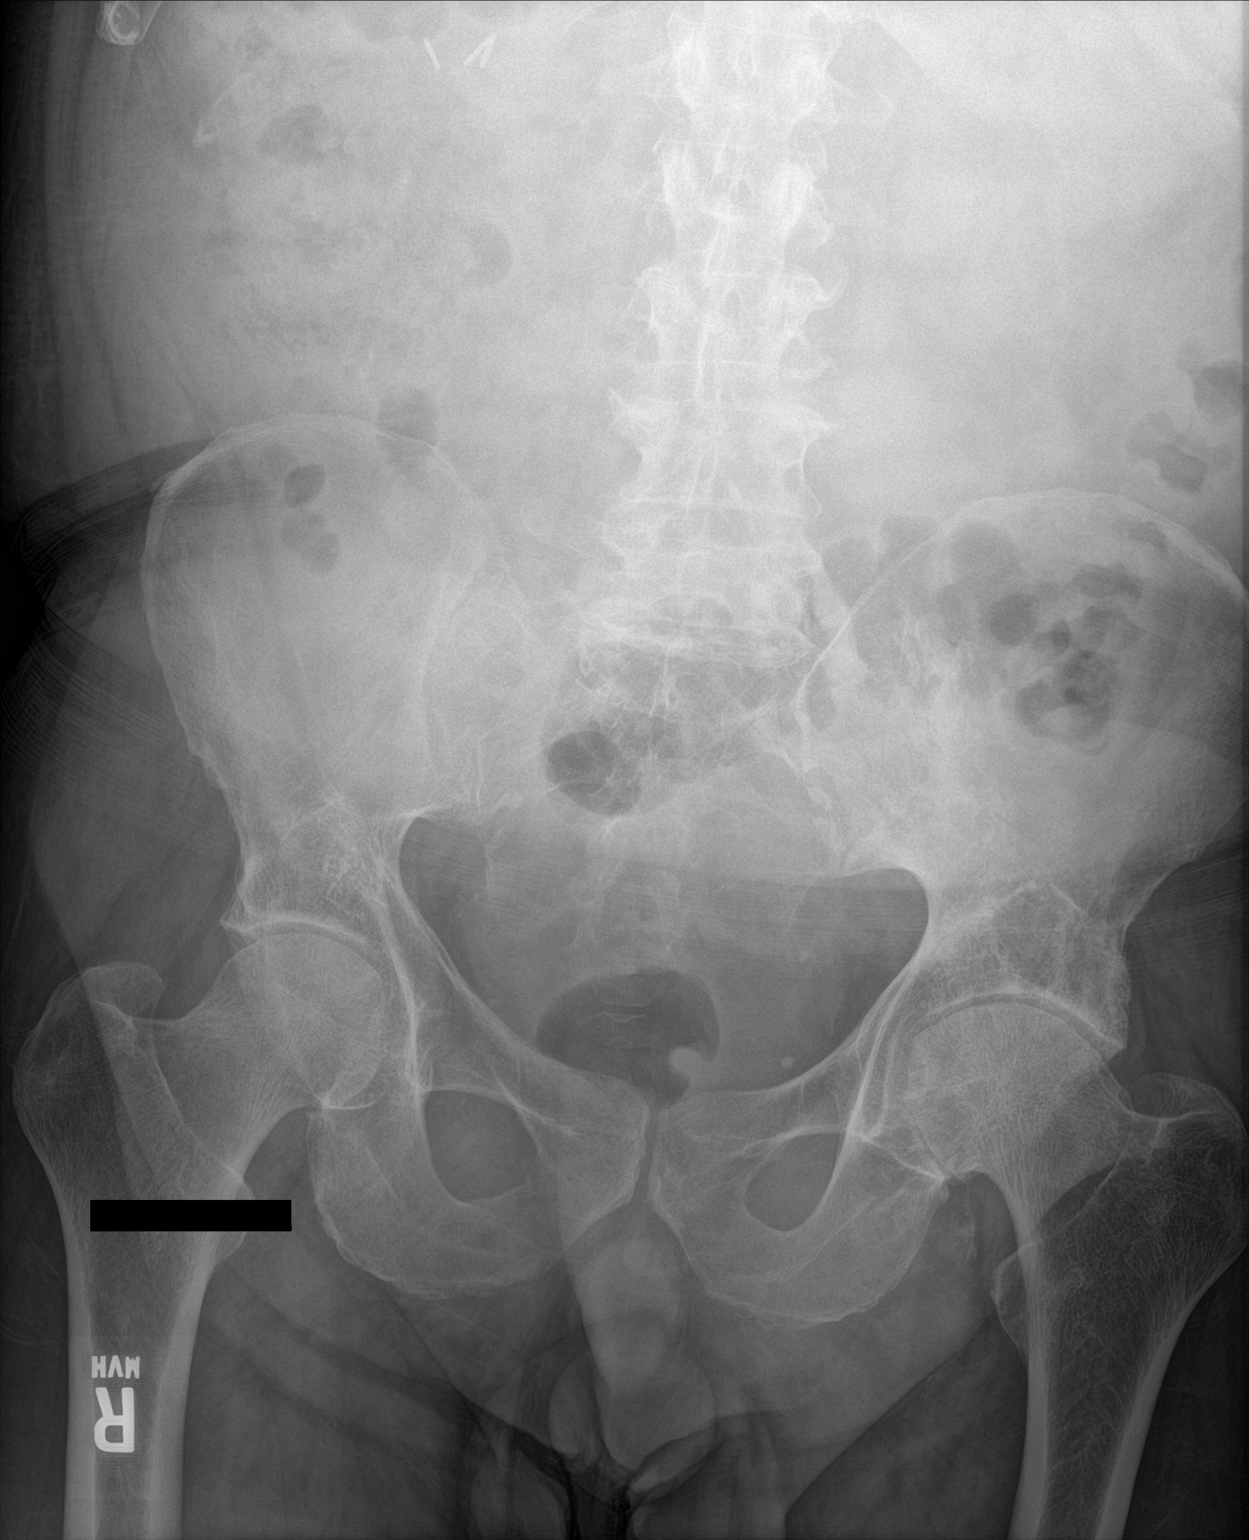

[abdomen kub (2 of 2)]
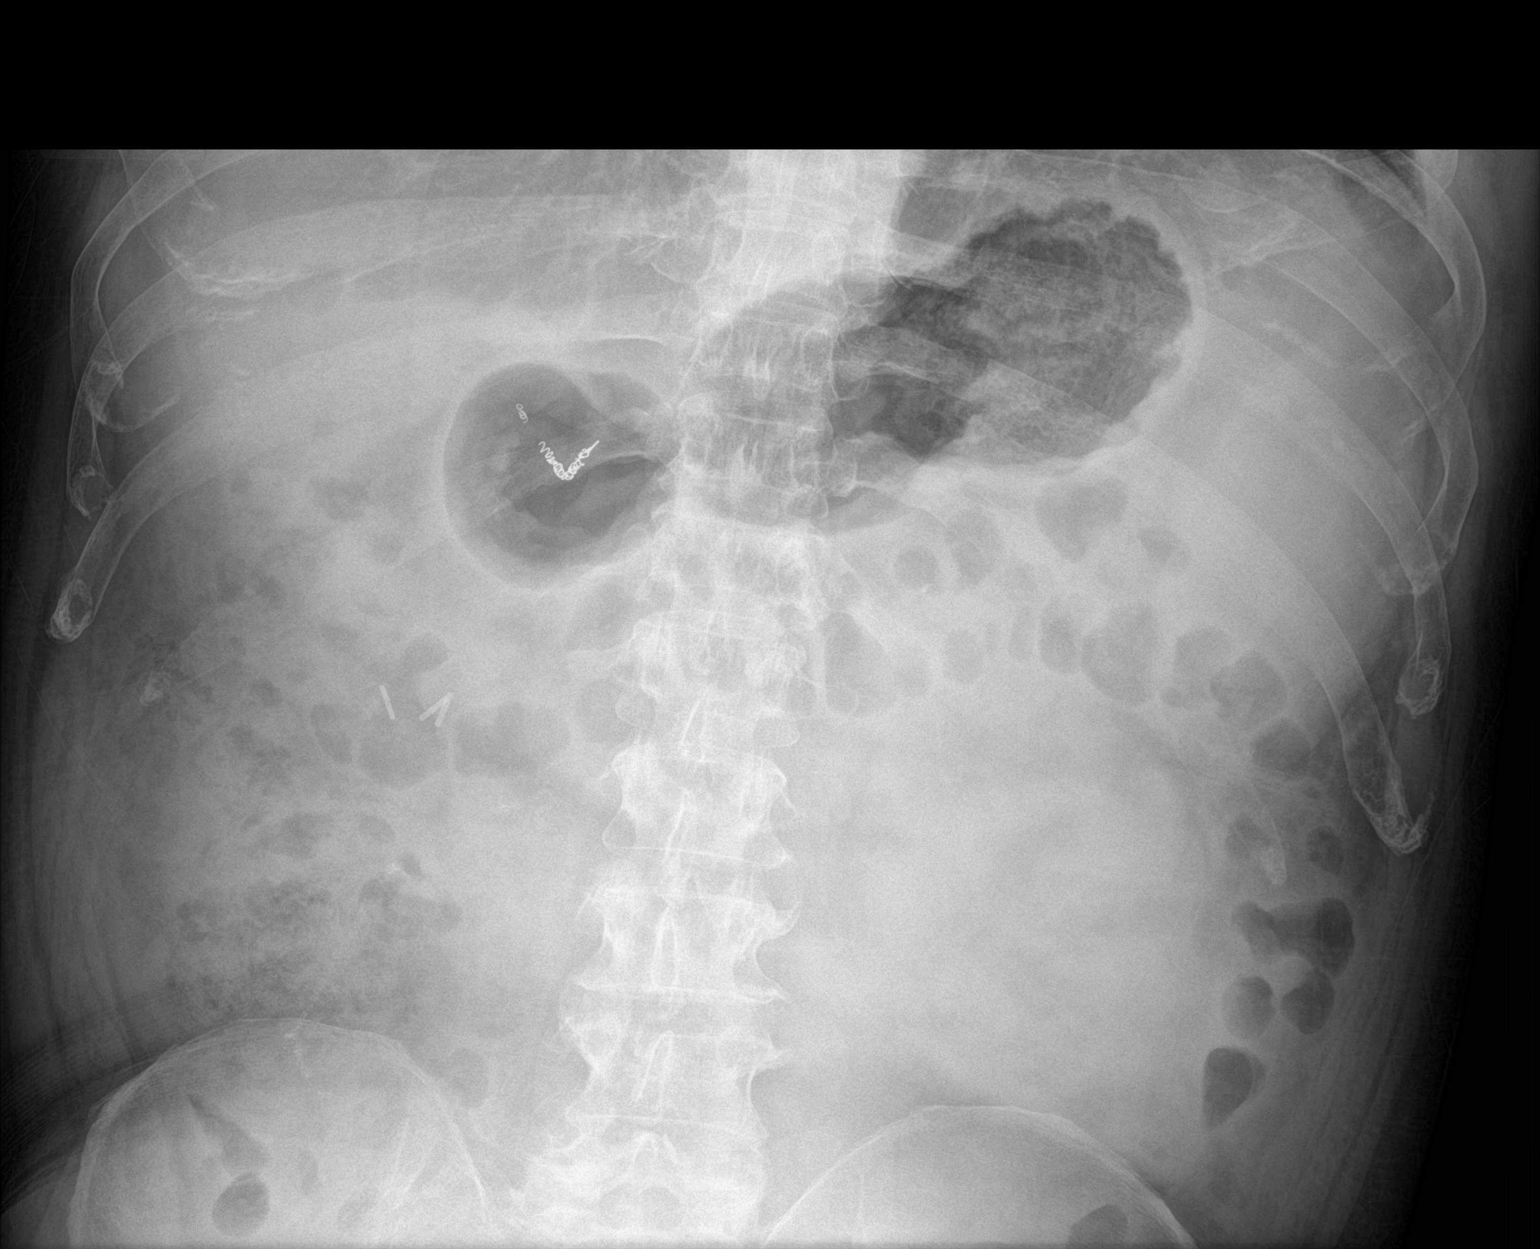

[2 of 2 positions shown; findings below may reference images not displayed]

FINDINGS: There is moderate stool throughout colon. There is no bowel
dilatation or air-fluid level suggesting bowel obstruction. No free
air. There is postoperative change in the right abdomen. There is
degenerative change in the lumbar spine.
IMPRESSION: No bowel obstruction or free air.

## 2017-05-22 IMAGING — CT CT ABD-PELV W/ CM
2 of 6 series · 15 of 46 positions shown, 17 images · IV contrast (iopamidol)
Comparison: 03/12/2016

CLINICAL DATA: Abdominal pain and vomiting for 1 day, colon cancer,
receiving chemotherapy, worsening abdominal pain and distention
since prior CT scan. History hypertension, coronary artery disease
post MI, diabetes mellitus

EXAM:
CT ABDOMEN AND PELVIS WITH CONTRAST
TECHNIQUE: Multidetector CT imaging of the abdomen and pelvis was performed
using the standard protocol following bolus administration of
intravenous contrast.
CONTRAST:  100mL 4Z24YK-[id number] IOPAMIDOL (4Z24YK-[id number]) INJECTION 61% IV.
Dilute oral contrast.

[Series 2: axial st · axial · 0.87mm/px · z∈[-184,+296]mm · 12 of 110 slices shown, 14 images]
[im 7/110  soft-tissue]
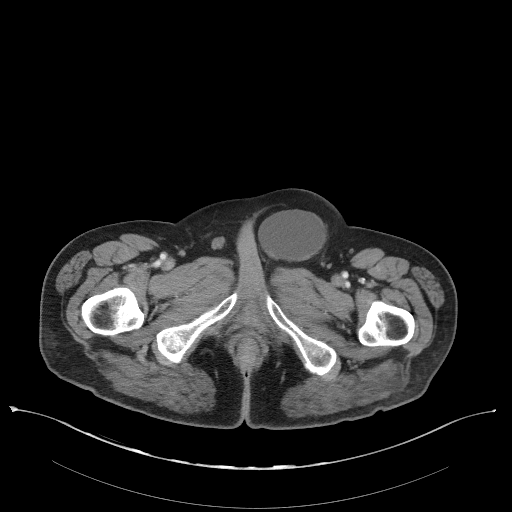
[im 7/110  bone]
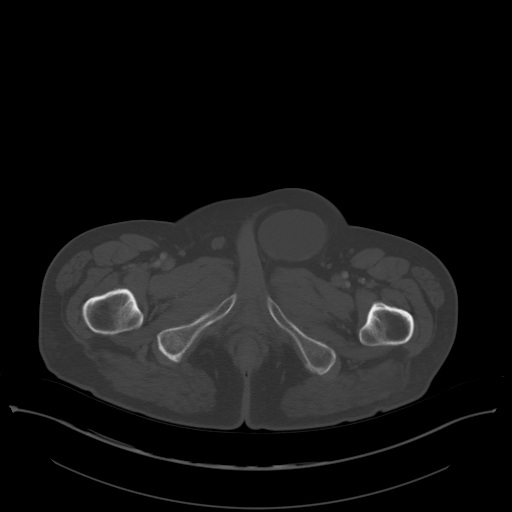
[im 20/110  soft-tissue]
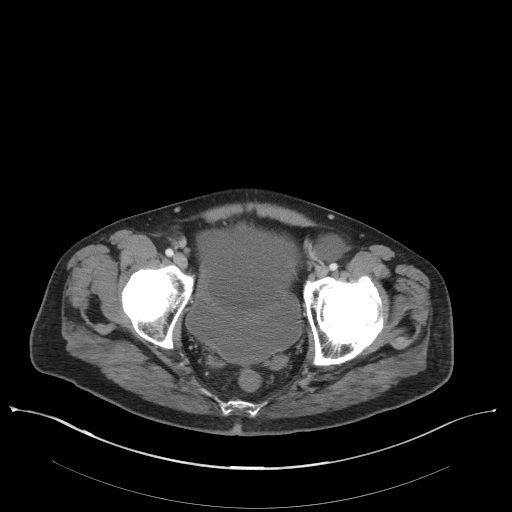
[im 26/110  soft-tissue]
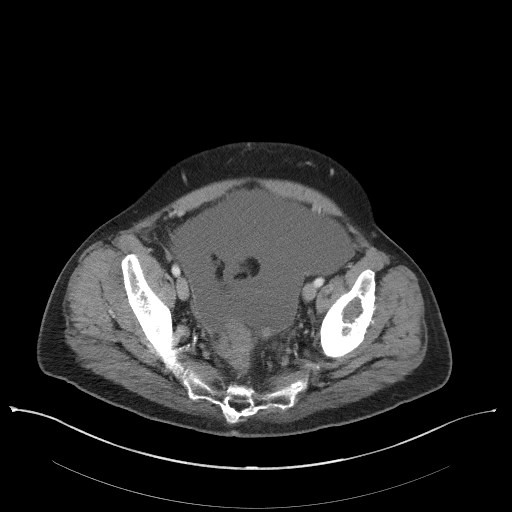
[im 33/110  soft-tissue]
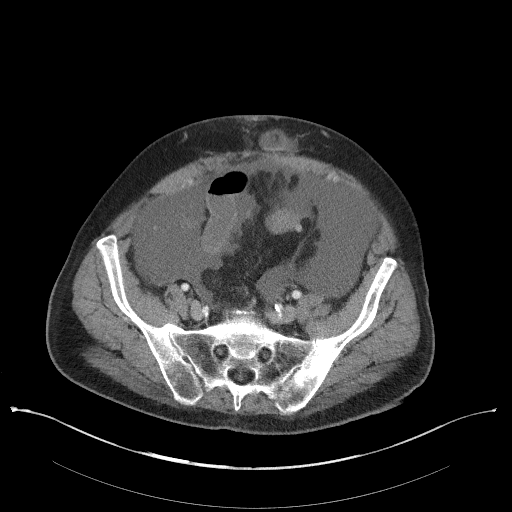
[im 45/110  soft-tissue]
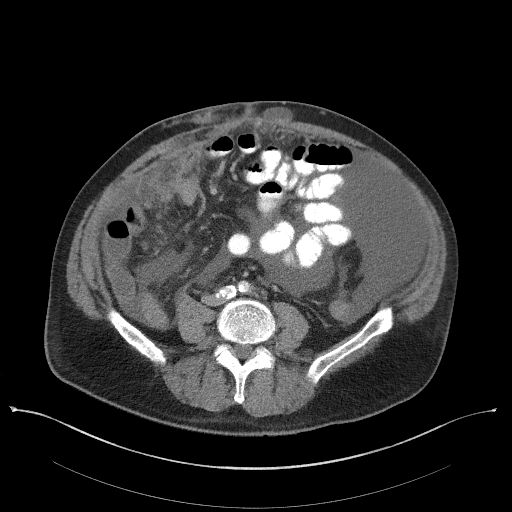
[im 52/110  soft-tissue]
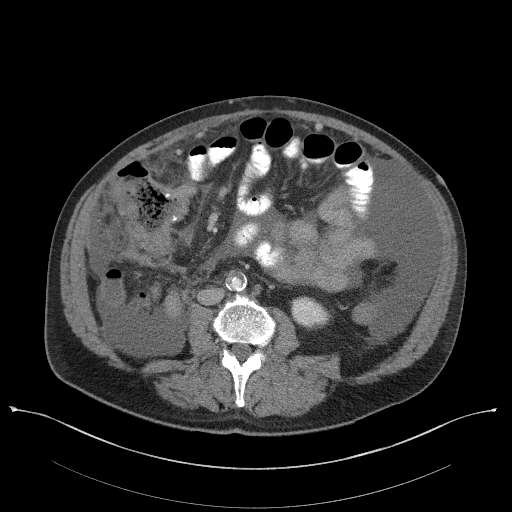
[im 58/110  soft-tissue]
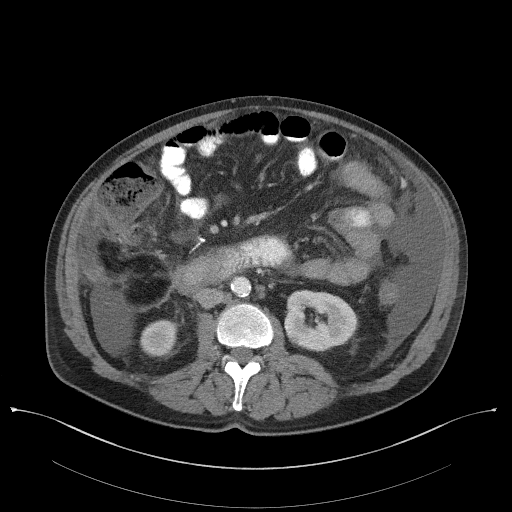
[im 71/110  soft-tissue]
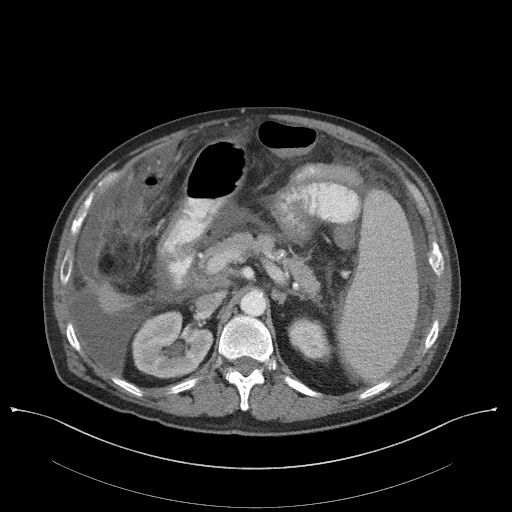
[im 77/110  soft-tissue]
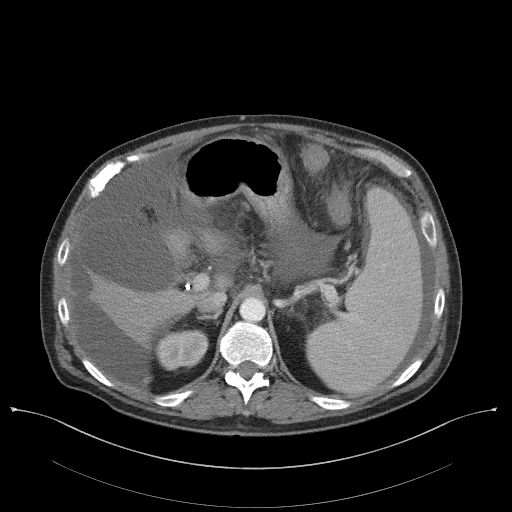
[im 77/110  bone]
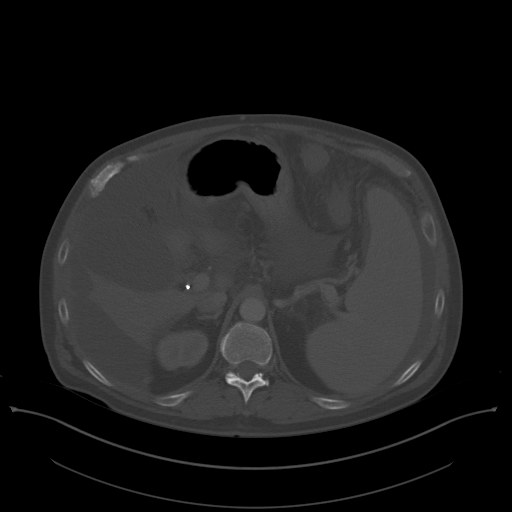
[im 84/110  soft-tissue]
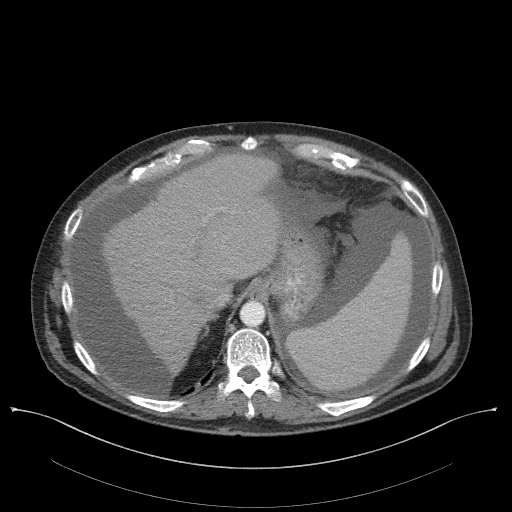
[im 97/110  soft-tissue]
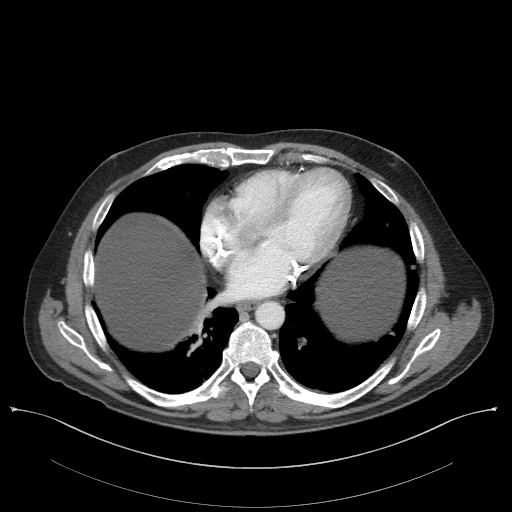
[im 103/110  soft-tissue]
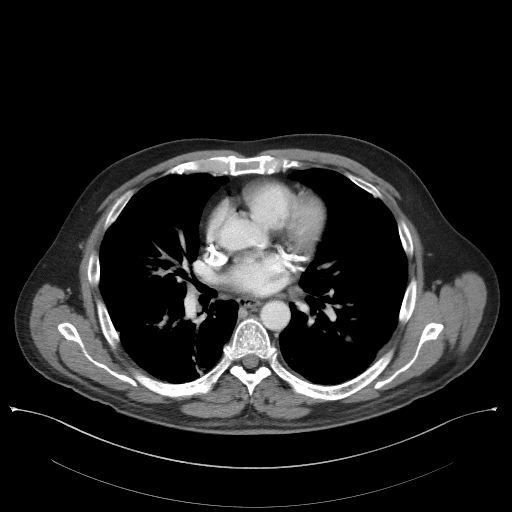

[Series 5: coronal st · coronal · 0.78mm/px · 3 of 101 slices shown]
[im 34/101  soft-tissue]
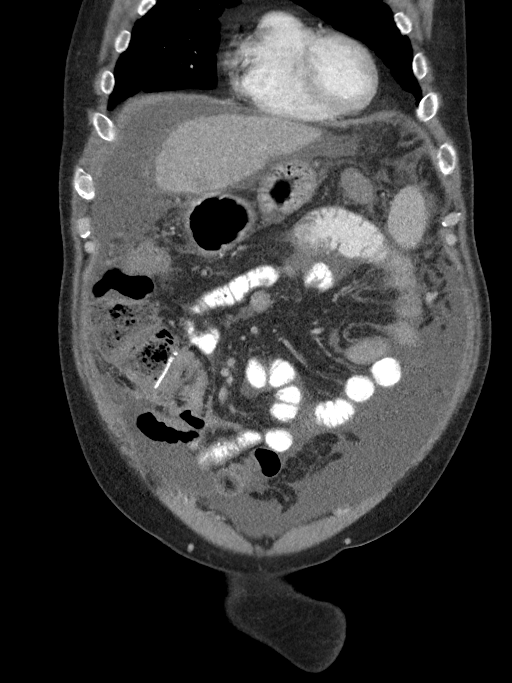
[im 45/101  soft-tissue]
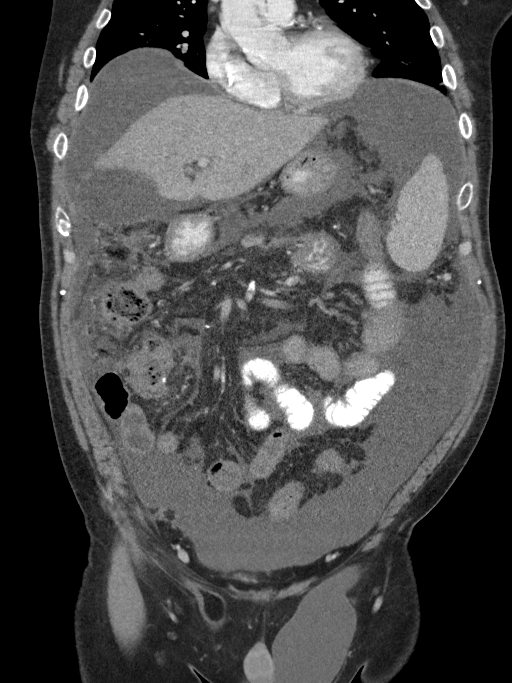
[im 56/101  soft-tissue]
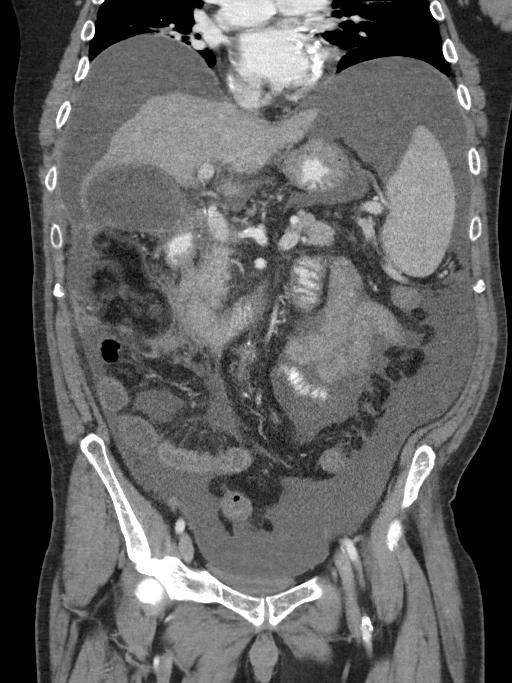

[15 of 46 positions shown; findings below may reference images not displayed]

FINDINGS: Lower chest: Subsegmental atelectasis RIGHT lower lobe. Bibasilar
pulmonary nodules compatible with metastases, not significantly
changed since recent prior exam.

Hepatobiliary: Nodular hepatic contours small liver size suggests
cirrhosis. Small exophytic nodules along the surface liver cannot
exclude metastases. Additionally, vague low-attenuation foci seen
within the liver concerning for hepatic metastases, largest 18 x 11
mm lateral segment LEFT lobe image 32. Distended gallbladder without
calcification.

Pancreas: Unremarkable

Spleen: Appears enlarged, 18.0 x 6.8 x 13.8 cm (volume = 880 cm3).
No focal lesions.

Adrenals/Urinary Tract: Kidneys, ureters, and bladder normal
appearance. Slight nodularity of both adrenal glands question small
adrenal metastases. Mild prostatic enlargement.

Stomach/Bowel: Prior ileocolic resection. Few sigmoid diverticula.
Stomach decompressed. Colon under distended, unable to exclude wall
thickening at the hepatic flexure region of this could be an
artifact from incomplete distention. Small bowel loops grossly
unremarkable.

Vascular/Lymphatic: Atherosclerotic calcifications aorta without
aneurysm. No definite adenopathy. Scattered old collaterals in the
RIGHT upper quadrant extending to the umbilicus.

Reproductive: N/A

Other: Extensive ascites. Extension of ascites in the LEFT inguinal
hernia. Several small ventral hernias containing ascites and fat.
Area of low-attenuation fat with surrounding margin at the lateral
RIGHT mid abdomen likely resection bed from ascending colectomy.

Musculoskeletal: Bones demineralized. Degenerative disc disease
changes L5-S1. Mixed lytic and sclerotic process of the LEFT
hemipelvis question Paget's disease.
IMPRESSION: Suspect cirrhotic liver with upper abdominal and abdominal wall
collaterals as well as splenomegaly.

Suspected hepatic and potentially small adrenal metastases.

Multiple lung base metastases bilaterally.

Increased ascites since previous exam.

LEFT inguinal hernia and small ventral hernias containing fat and
fluid.

Aortic atherosclerosis and coronary arterial calcifications.

Unable to exclude segmental colonic wall thickening at the hepatic
flexure of the colon though this could be potentially artifactual
related to underdistention.

Distended gallbladder.
